# Patient Record
Sex: Male | Born: 1955 | Race: Black or African American | Hispanic: No | Marital: Single | Smoking: Current every day smoker
Health system: Southern US, Community
[De-identification: ages and names within clinical notes are randomized; demographics above are authoritative.]

## PROBLEM LIST (undated history)

## (undated) DIAGNOSIS — K219 Gastro-esophageal reflux disease without esophagitis: Secondary | ICD-10-CM

## (undated) DIAGNOSIS — R569 Unspecified convulsions: Secondary | ICD-10-CM

## (undated) DIAGNOSIS — S0990XA Unspecified injury of head, initial encounter: Secondary | ICD-10-CM

## (undated) DIAGNOSIS — I10 Essential (primary) hypertension: Secondary | ICD-10-CM

## (undated) DIAGNOSIS — S31119A Laceration without foreign body of abdominal wall, unspecified quadrant without penetration into peritoneal cavity, initial encounter: Secondary | ICD-10-CM

## (undated) HISTORY — PX: HIP SURGERY: SHX245

## (undated) HISTORY — PX: COLONOSCOPY W/ POLYPECTOMY: SHX1380

## (undated) HISTORY — PX: BACK SURGERY: SHX140

---

## 1981-09-22 HISTORY — PX: EXPLORATORY LAPAROTOMY: SUR591

## 1998-10-04 ENCOUNTER — Emergency Department (HOSPITAL_COMMUNITY): Admission: EM | Admit: 1998-10-04 | Discharge: 1998-10-04 | Payer: Self-pay | Admitting: *Deleted

## 1999-07-24 ENCOUNTER — Emergency Department (HOSPITAL_COMMUNITY): Admission: EM | Admit: 1999-07-24 | Discharge: 1999-07-24 | Payer: Self-pay | Admitting: Emergency Medicine

## 1999-10-01 HISTORY — PX: CHEST TUBE INSERTION: SHX231

## 2000-03-14 ENCOUNTER — Inpatient Hospital Stay (HOSPITAL_COMMUNITY): Admission: EM | Admit: 2000-03-14 | Discharge: 2000-03-18 | Payer: Self-pay

## 2000-03-14 ENCOUNTER — Encounter: Payer: Self-pay | Admitting: Surgery

## 2000-03-15 ENCOUNTER — Encounter: Payer: Self-pay | Admitting: Surgery

## 2000-03-16 ENCOUNTER — Encounter: Payer: Self-pay | Admitting: Surgery

## 2000-03-17 ENCOUNTER — Encounter: Payer: Self-pay | Admitting: Surgery

## 2008-09-13 ENCOUNTER — Emergency Department (HOSPITAL_COMMUNITY): Admission: EM | Admit: 2008-09-13 | Discharge: 2008-09-13 | Payer: Self-pay | Admitting: Emergency Medicine
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
## 2011-09-05 ENCOUNTER — Emergency Department (HOSPITAL_COMMUNITY)
Admission: EM | Admit: 2011-09-05 | Discharge: 2011-09-05 | Disposition: A | Discharge status: Home / Self Care | Payer: 59 | Attending: Emergency Medicine | Admitting: Emergency Medicine

## 2011-09-05 DIAGNOSIS — R0981 Nasal congestion: Secondary | ICD-10-CM

## 2011-09-05 DIAGNOSIS — J3489 Other specified disorders of nose and nasal sinuses: Secondary | ICD-10-CM | POA: Insufficient documentation

## 2011-09-05 DIAGNOSIS — M62838 Other muscle spasm: Secondary | ICD-10-CM | POA: Insufficient documentation

## 2011-09-05 DIAGNOSIS — D179 Benign lipomatous neoplasm, unspecified: Secondary | ICD-10-CM | POA: Insufficient documentation

## 2011-09-05 HISTORY — DX: Unspecified convulsions: R56.9

## 2011-09-05 HISTORY — DX: Essential (primary) hypertension: I10

## 2011-09-05 MED ORDER — DIPHENHYDRAMINE HCL 25 MG PO TABS: 25.0000 mg | ORAL_TABLET | Freq: Four times a day (QID) | ORAL | Status: AC

## 2011-09-05 MED ORDER — IBUPROFEN 800 MG PO TABS
800.0000 mg | ORAL_TABLET | Freq: Once | ORAL | Status: AC
  Administered 2011-09-05: 800 mg via ORAL
  Filled 2011-09-05: qty 1

## 2011-09-05 MED ORDER — IBUPROFEN 800 MG PO TABS: 800.0000 mg | ORAL_TABLET | Freq: Three times a day (TID) | ORAL | Status: AC

## 2011-09-05 MED ORDER — DIPHENHYDRAMINE HCL 25 MG PO CAPS
25.0000 mg | ORAL_CAPSULE | Freq: Once | ORAL | Status: AC
  Administered 2011-09-05: 25 mg via ORAL
  Filled 2011-09-05: qty 1

## 2011-09-05 MED ORDER — CYCLOBENZAPRINE HCL 10 MG PO TABS: 10.0000 mg | ORAL_TABLET | Freq: Three times a day (TID) | ORAL | Status: AC | PRN

## 2011-09-05 NOTE — ED Notes (Signed)
Pt reports that he has a mass on his back for several years which is getting bigger.  Mass is located on the upper back, midline.  Pt reports occasional stiffness of the neck.  Pt states "I need my head checked.  I hear a squeeking noise when I turn my head.  Sounds like fluid swooshing".  Denies nausea/vomiting.  Pt reports that at times when he turns his head, the neck pops on the right side and causes pain.  No medicine taken.  No PCP.

## 2011-09-05 NOTE — ED Provider Notes (Signed)
History     CSN: [id number] Arrival date & time: 09/05/2011  4:56 PM   First MD Initiated Contact with Patient 09/05/11 1739      Chief Complaint  Patient presents with  . Back Pain    Cervical area of spine    (Consider location/radiation/quality/duration/timing/severity/associated sxs/prior treatment) HPI  Patient presents to ER complaining of a multiple month hx of a "swooshing sensation in ears" and "fullness in ears" that is intermittent and aggravated with movement of head but denies associated fevers, chills, HA, dizziness, nausea or vomiting. Patient also complaining of a multiple year hx of a fatty mass on his upper back that is non tender but that he states has grown in size over the years. Patient is also complaining of a multiple month hx of intermittent right lateral neck pain and stiffness with pain aggravated by movement but then will resolve on its own because patient has taken no medications for any of the complaints PTA. Patient has no PCP as this times and takes no meds on regular basis but states that he has hx of HTN and seizure disorder.   Past Medical History  Diagnosis Date  . Hypertension   . Seizures     Past Surgical History  Procedure Date  . Abdominal surgery   . Back surgery     No family history on file.  History  Substance Use Topics  . Smoking status: Current Everyday Smoker -- 0.5 packs/day    Types: Cigarettes  . Smokeless tobacco: Not on file  . Alcohol Use: Yes     Weekends      Review of Systems  All other systems reviewed and are negative.    Allergies  Review of patient's allergies indicates no known allergies.  Home Medications   Current Outpatient Rx  Name Route Sig Dispense Refill  . CYCLOBENZAPRINE HCL 10 MG PO TABS Oral Take 1 tablet (10 mg total) by mouth 3 (three) times daily as needed for muscle spasms. 30 tablet 0  . DIPHENHYDRAMINE HCL 25 MG PO TABS Oral Take 1 tablet (25 mg total) by mouth every 6 (six) hours.  20 tablet 0  . IBUPROFEN 800 MG PO TABS Oral Take 1 tablet (800 mg total) by mouth 3 (three) times daily. 21 tablet 0    BP 154/75  Pulse 82  Temp(Src) 98.5 F (36.9 C) (Oral)  Resp 16  SpO2 97%  Physical Exam  Nursing note and vitals reviewed. Constitutional: He is oriented to person, place, and time. He appears well-developed and well-nourished. No distress.  HENT:  Head: Normocephalic and atraumatic.  Right Ear: External ear normal.  Left Ear: External ear normal.  Mouth/Throat: Oropharynx is clear and moist.  Eyes: Conjunctivae and EOM are normal. Pupils are equal, round, and reactive to light.  Neck: Normal range of motion. Neck supple.  Cardiovascular: Normal rate, regular rhythm, normal heart sounds and intact distal pulses.  Exam reveals no gallop and no friction rub.   No murmur heard. Pulmonary/Chest: Effort normal and breath sounds normal. No respiratory distress. He has no wheezes. He has no rales. He exhibits no tenderness.  Abdominal: Bowel sounds are normal. He exhibits no distension and no mass. There is no tenderness. There is no rebound and no guarding.  Musculoskeletal: Normal range of motion. He exhibits no edema and no tenderness.       Large baseball size fatty tumor like mass of central upper back without TTP and no erythema or heat  Mild  TTP of right lateral lower neck with muscle spasticity but no cspine TTP.   Neurological: He is alert and oriented to person, place, and time.  Skin: Skin is warm and dry. No rash noted. He is not diaphoretic. No erythema.  Psychiatric: He has a normal mood and affect.    ED Course  Procedures (including critical care time)  Labs Reviewed - No data to display No results found.   1. Nasal congestion   2. Lipoma   3. Muscle spasm       MDM  Afebrile, alert and oriented without neurofocal findings with a long standing hx of intermittent "swooshing sensation and fullness in ears" likely congestion and drainage with  no ataxia. Soft tissue TTP of lower lateral neck with muscle spasticity that is chronic, waxing and waning. And chronic lipoma of back without signs or symptoms of abscess or infection.         Jenness Corner, [place] 09/05/11 2134

## 2011-09-06 NOTE — ED Provider Notes (Signed)
Medical screening examination/treatment/procedure(s) were performed by non-physician practitioner and as supervising physician I was immediately available for consultation/collaboration.  Raeford Razor, MD 09/06/11 [phone]

## 2012-01-18 ENCOUNTER — Encounter (HOSPITAL_COMMUNITY): Payer: Self-pay

## 2012-01-18 ENCOUNTER — Emergency Department (HOSPITAL_COMMUNITY)
Admission: EM | Admit: 2012-01-18 | Discharge: 2012-01-18 | Disposition: A | Discharge status: Home / Self Care | Payer: 59 | Source: Home / Self Care

## 2012-01-18 DIAGNOSIS — R569 Unspecified convulsions: Secondary | ICD-10-CM

## 2012-01-18 DIAGNOSIS — I10 Essential (primary) hypertension: Secondary | ICD-10-CM

## 2012-01-18 LAB — POCT I-STAT, CHEM 8
Creatinine, Ser: 1.1 mg/dL (ref 0.50–1.35)
HCT: 46 % (ref 39.0–52.0)
Hemoglobin: 15.6 g/dL (ref 13.0–17.0)
Potassium: 4.1 mEq/L (ref 3.5–5.1)
Sodium: 139 mEq/L (ref 135–145)

## 2012-01-18 NOTE — Discharge Instructions (Signed)
You cannot drive for 6 months after having a seizure. No drinking! Call [facility] for assistance obtaining a primary care dr for follow up of your high blood pressure, and seizures.  Return as needed.
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
## 2012-01-18 NOTE — ED Provider Notes (Signed)
History     CSN: [id number]  Arrival date & time 01/18/12  1138   None     Chief Complaint  Patient presents with  . Seizures    (Consider location/radiation/quality/duration/timing/severity/associated sxs/prior treatment) HPI Comments: Patient presents today reporting that he had the seizure 6 days ago. He admits that he was drinking heavily Saturday and Sunday last weekend, and that he had one seizure on Sunday. He denies any seizures since then. Patient reports that he does have a history of seizure disorder. He was on phenobarbital, and was weaned off over 20 years ago. Patient is also concerned, and brings paperwork from an employee her screening which showed an elevated blood pressure of 198/120 on 10/16/2011 . He does not have a primary care physician and has not had blood pressure rechecked prior to today. He has seen a neurologist in the past, over 20 years ago. He denies headaches, dizziness, chest discomfort, or dyspnea.   Past Medical History  Diagnosis Date  . Hypertension   . Seizures     Past Surgical History  Procedure Date  . Abdominal surgery   . Back surgery     History reviewed. No pertinent family history.  History  Substance Use Topics  . Smoking status: Current Everyday Smoker -- 0.5 packs/day    Types: Cigarettes  . Smokeless tobacco: Not on file  . Alcohol Use: Yes     Weekends      Review of Systems  Constitutional: Negative for fever and chills.  Respiratory: Negative for cough and shortness of breath.   Cardiovascular: Negative for chest pain.  Neurological: Positive for seizures. Negative for dizziness and headaches.    Allergies  Review of patient's allergies indicates no known allergies.  Home Medications  No current outpatient prescriptions on file.  BP 153/88  Pulse 87  Temp(Src) 99 F (37.2 C) (Oral)  Resp 19  SpO2 97%  Physical Exam  Nursing note and vitals reviewed. Constitutional: He is oriented to person, place,  and time. He appears well-developed and well-nourished. No distress.  HENT:  Head: Normocephalic and atraumatic.  Right Ear: Tympanic membrane, external ear and ear canal normal.  Left Ear: Tympanic membrane, external ear and ear canal normal.  Nose: Nose normal.  Mouth/Throat: Uvula is midline, oropharynx is clear and moist and mucous membranes are normal. No oropharyngeal exudate, posterior oropharyngeal edema or posterior oropharyngeal erythema.  Eyes: Conjunctivae and EOM are normal. Pupils are equal, round, and reactive to light.  Neck: Neck supple.  Cardiovascular: Normal rate, regular rhythm and normal heart sounds.   Pulmonary/Chest: Effort normal and breath sounds normal. No respiratory distress.  Musculoskeletal: Normal range of motion.  Lymphadenopathy:    He has no cervical adenopathy.  Neurological: He is alert and oriented to person, place, and time. No cranial nerve deficit.  Skin: Skin is warm and dry.  Psychiatric: He has a normal mood and affect.    ED Course  Procedures (including critical care time)   Labs Reviewed  POCT I-STAT, CHEM 8   No results found.   1. Seizure   2. Hypertension       MDM  Single seizure 6 days ago. Suspect alcohol withdrawal. Hx of seizure d/o, treated > 20 yrs ago per pt. Systolic BP elevation. Hx of same Dec 2012 ED visit, with reported BP elevation at employer screening.  Encouraged pt to obtain PCP for f/u of BP and seizures. Advised no driving for 6 mos, unless cleared earlier by PCP.  Melody Comas, [place] 01/18/12 1243

## 2012-01-22 NOTE — ED Provider Notes (Signed)
Medical screening examination/treatment/procedure(s) were performed by resident physician or non-physician practitioner and as supervising physician I was immediately available for consultation/collaboration.   Barkley Bruns MD.    Linna Hoff, MD 01/22/12 [phone]

## 2013-02-04 ENCOUNTER — Emergency Department (HOSPITAL_COMMUNITY)
Admission: EM | Admit: 2013-02-04 | Discharge: 2013-02-04 | Disposition: A | Discharge status: Home / Self Care | Payer: 59 | Source: Home / Self Care | Attending: Family Medicine | Admitting: Family Medicine

## 2013-02-04 ENCOUNTER — Encounter (HOSPITAL_COMMUNITY): Payer: Self-pay | Admitting: *Deleted

## 2013-02-04 DIAGNOSIS — R221 Localized swelling, mass and lump, neck: Secondary | ICD-10-CM

## 2013-02-04 DIAGNOSIS — R220 Localized swelling, mass and lump, head: Secondary | ICD-10-CM

## 2013-02-04 MED ORDER — TRAMADOL HCL 50 MG PO TABS: 50.0000 mg | ORAL_TABLET | Freq: Four times a day (QID) | ORAL | Status: DC | PRN

## 2013-02-04 MED ORDER — IBUPROFEN 600 MG PO TABS: 600.0000 mg | ORAL_TABLET | Freq: Three times a day (TID) | ORAL | Status: DC

## 2013-02-04 MED ORDER — CEPHALEXIN 500 MG PO CAPS: 500.0000 mg | ORAL_CAPSULE | Freq: Two times a day (BID) | ORAL | Status: DC

## 2013-02-04 NOTE — ED Notes (Signed)
Pt  Reports  Neck  Pain  And  Swollen  Area      To  Neck     X  sev  Months           Pt  Reports    Pain  And  Some  Swelling to r  Upper  Chest        Pt    Reports  He  Does  Lots  Of  Lifting  At  Work

## 2013-02-05 NOTE — ED Provider Notes (Signed)
History     CSN: [id number]  Arrival date & time 02/04/13  1705   First MD Initiated Contact with Patient 02/04/13 1818      Chief Complaint  Patient presents with  . Torticollis    (Consider location/radiation/quality/duration/timing/severity/associated sxs/prior treatment) HPI Comments: 57 year old smoker male with history of hypertension. Here complaining of right-sided neck lump for months. Lump has become tender in the last few days. He works lifting tires. He also has a mass at the base of his neck in the Center upper back that he thinks came after he had a back surgery years ago. Denies fever or chills. No general malaise. No weight loss. No chest pain, cough or shortness of breath.  Patient also wants me to check his right clavicle as he has not is some deformity for years, reports intermittent pain with heavy lifting. Denies redness, denies upper extremity numbness, weakness or paresthesias. In an and   Past Medical History  Diagnosis Date  . Hypertension   . Seizures     Past Surgical History  Procedure Laterality Date  . Abdominal surgery    . Back surgery      No family history on file.  History  Substance Use Topics  . Smoking status: Current Every Day Smoker -- 0.50 packs/day    Types: Cigarettes  . Smokeless tobacco: Not on file  . Alcohol Use: Yes     Comment: Weekends      Review of Systems  Constitutional: Negative for fever, chills, diaphoresis, appetite change, fatigue and unexpected weight change.  HENT: Negative for ear pain, congestion, sore throat, mouth sores, neck stiffness and sinus pressure.   Respiratory: Negative for cough, shortness of breath and wheezing.   Cardiovascular: Negative for chest pain and leg swelling.  Gastrointestinal: Negative for nausea, vomiting and abdominal pain.  Musculoskeletal:       As per HPI  Skin:       As per HPI  Neurological: Negative for dizziness and headaches.  All other systems reviewed and are  negative.    Allergies  Review of patient's allergies indicates no known allergies.  Home Medications   Current Outpatient Rx  Name  Route  Sig  Dispense  Refill  . cephALEXin (KEFLEX) 500 MG capsule   Oral   Take 1 capsule (500 mg total) by mouth 2 (two) times daily.   20 capsule   0   . ibuprofen (ADVIL,MOTRIN) 600 MG tablet   Oral   Take 1 tablet (600 mg total) by mouth 3 (three) times daily. Take it with food   30 tablet   0   . traMADol (ULTRAM) 50 MG tablet   Oral   Take 1 tablet (50 mg total) by mouth every 6 (six) hours as needed for pain.   15 tablet   0     BP 149/73  Pulse 79  Temp(Src) 98.3 F (36.8 C) (Oral)  Resp 16  SpO2 100%  Physical Exam  Nursing note and vitals reviewed. Constitutional: He is oriented to person, place, and time. He appears well-developed and well-nourished. No distress.  HENT:  Head: Normocephalic and atraumatic.  Right Ear: External ear normal.  Left Ear: External ear normal.  Nose: Nose normal.  Mouth/Throat: Oropharynx is clear and moist. No oropharyngeal exudate.  Eyes: Conjunctivae and EOM are normal. Pupils are equal, round, and reactive to light. Right eye exhibits no discharge. Left eye exhibits no discharge. No scleral icterus.  Neck: Normal range of motion. Neck  supple. No JVD present. No thyromegaly present.  Cardiovascular: Normal rate, regular rhythm and normal heart sounds.   Pulmonary/Chest: Effort normal and breath sounds normal. No respiratory distress. He has no wheezes. He has no rales. He exhibits no tenderness.  Musculoskeletal:  Right acromioclavicular joint appears more prominent than left. No tenderness to palpation. Otherwise no obvious deformity of the clavicular shaft. No associated soft tissue swelling or supraclavicular adenopathies. Patient present with FROM of right shoulder and arm.   Neurological: He is alert and oriented to person, place, and time.  Skin: He is not diaphoretic.  There is a  tender mass about 2 cm soft in right suboccipital area. Mildly tender to palpation, firm but no indurated no fluctuation no erythema associated. No attached to deep planes. Patient shaves his scalp. Scalp skin appear intact with no dandruff or skin brakes or signs of infection.   There is a large mass about 10x10 cm firm no attached to deep planes, no tender, no fluctuant located in center of upper back at base of neck between scapulae. No signs of infection.      ED Course  Procedures (including critical care time)  Labs Reviewed - No data to display No results found.   1. Localized swelling, mass or lump of neck       MDM  Treated with Keflex, ibuprofen and tramadol. Impress irritated sebaceus cyst likely due to constant rubbing. Does not impress an abscess.  I explained patient that he needs to have followup for his "neck lump" as there is a possibility of a inflamed sebaceous/epidermal cyst or a reactive lymph node. He also has a large skin mass in his upper back likely a sebaceous cyst. Patient has medical insurance and I provided him with a list of primary care offices in our area for him to followup and monitor his symptoms I explained to him that he might require biopsy or surgical removal and also provided with contact information for [facility]. Supportive care and red flags should prompt his return to medical attention discussed with patient and provided in writing        Sharin Grave, MD 02/05/13 1253

## 2013-02-15 ENCOUNTER — Encounter (INDEPENDENT_AMBULATORY_CARE_PROVIDER_SITE_OTHER): Payer: Self-pay | Admitting: Surgery

## 2013-02-15 ENCOUNTER — Ambulatory Visit (INDEPENDENT_AMBULATORY_CARE_PROVIDER_SITE_OTHER): Payer: 59 | Admitting: Surgery

## 2013-02-15 ENCOUNTER — Telehealth (INDEPENDENT_AMBULATORY_CARE_PROVIDER_SITE_OTHER): Payer: Self-pay

## 2013-02-15 VITALS — BP 168/82 | HR 66 | Resp 18 | Ht 68.0 in | Wt 180.0 lb

## 2013-02-15 DIAGNOSIS — R599 Enlarged lymph nodes, unspecified: Secondary | ICD-10-CM | POA: Insufficient documentation

## 2013-02-15 DIAGNOSIS — R229 Localized swelling, mass and lump, unspecified: Secondary | ICD-10-CM

## 2013-02-15 DIAGNOSIS — R222 Localized swelling, mass and lump, trunk: Secondary | ICD-10-CM

## 2013-02-15 DIAGNOSIS — Z720 Tobacco use: Secondary | ICD-10-CM

## 2013-02-15 DIAGNOSIS — F17200 Nicotine dependence, unspecified, uncomplicated: Secondary | ICD-10-CM

## 2013-02-15 MED ORDER — NAPROXEN 500 MG PO TABS: 500.0000 mg | ORAL_TABLET | Freq: Two times a day (BID) | ORAL | Status: DC

## 2013-02-15 NOTE — Patient Instructions (Addendum)
I believe the sore lump on the back of your neck is an irritated lymph node.  Try naproxen 500 mg twice a day and heat six times a day for three weeks.  It should help calm down.  If it does not or worsens, consider surgical removal.  Lymphadenopathy Lymphadenopathy means "disease of the lymph glands." But the term is usually used to describe swollen or enlarged lymph glands, also called lymph nodes. These are the bean-shaped organs found in many locations including the neck, underarm, and groin. Lymph glands are part of the immune system, which fights infections in your body. Lymphadenopathy can occur in just one area of the body, such as the neck, or it can be generalized, with lymph node enlargement in several areas. The nodes found in the neck are the most common sites of lymphadenopathy. CAUSES  When your immune system responds to germs (such as viruses or bacteria ), infection-fighting cells and fluid build up. This causes the glands to grow in size. This is usually not something to worry about. Sometimes, the glands themselves can become infected and inflamed. This is called lymphadenitis. Enlarged lymph nodes can be caused by many diseases:  Bacterial disease, such as strep throat or a skin infection.  Viral disease, such as a common cold.  Other germs, such as lyme disease, tuberculosis, or sexually transmitted diseases.  Cancers, such as lymphoma (cancer of the lymphatic system) or leukemia (cancer of the white blood cells).  Inflammatory diseases such as lupus or rheumatoid arthritis.  Reactions to medications. Many of the diseases above are rare, but important. This is why you should see your caregiver if you have lymphadenopathy. SYMPTOMS   Swollen, enlarged lumps in the neck, back of the head or other locations.  Tenderness.  Warmth or redness of the skin over the lymph nodes.  Fever. DIAGNOSIS  Enlarged lymph nodes are often near the source of infection. They can help  healthcare providers diagnose your illness. For instance:   Swollen lymph nodes around the jaw might be caused by an infection in the mouth.  Enlarged glands in the neck often signal a throat infection.  Lymph nodes that are swollen in more than one area often indicate an illness caused by a virus. Your caregiver most likely will know what is causing your lymphadenopathy after listening to your history and examining you. Blood tests, x-rays or other tests may be needed. If the cause of the enlarged lymph node cannot be found, and it does not go away by itself, then a biopsy may be needed. Your caregiver will discuss this with you. TREATMENT  Treatment for your enlarged lymph nodes will depend on the cause. Many times the nodes will shrink to normal size by themselves, with no treatment. Antibiotics or other medicines may be needed for infection. Only take over-the-counter or prescription medicines for pain, discomfort or fever as directed by your caregiver. HOME CARE INSTRUCTIONS  Swollen lymph glands usually return to normal when the underlying medical condition goes away. If they persist, contact your health-care provider. He/she might prescribe antibiotics or other treatments, depending on the diagnosis. Take any medications exactly as prescribed. Keep any follow-up appointments made to check on the condition of your enlarged nodes.  SEEK MEDICAL CARE IF:   Swelling lasts for more than two weeks.  You have symptoms such as weight loss, night sweats, fatigue or fever that does not go away.  The lymph nodes are hard, seem fixed to the skin or are growing  rapidly.  Skin over the lymph nodes is red and inflamed. This could mean there is an infection. SEEK IMMEDIATE MEDICAL CARE IF:   Fluid starts leaking from the area of the enlarged lymph node.  You develop a fever of 102 F (38.9 C) or greater.  Severe pain develops (not necessarily at the site of a large lymph node).  You develop  chest pain or shortness of breath.  You develop worsening abdominal pain. MAKE SURE YOU:   Understand these instructions.  Will watch your condition.  Will get help right away if you are not doing well or get worse. Document Released: 06/25/2008 Document Revised: 12/09/2011 Document Reviewed: 06/25/2008 [facility] Patient Information 2013 [place], [place].  The larger mass on your upper back is most likely a lipoma.  Consider removal if it is bothering you.  It seems to gotten larger since we have documented that 20 years ago.  Lipoma A lipoma is a noncancerous (benign) tumor composed of fat cells. They are usually found under the skin (subcutaneous). A lipoma may occur in any tissue of the body that contains fat. Common areas for lipomas to appear include the back, shoulders, buttocks, and thighs. Lipomas are a very common soft tissue growth. They are soft and grow slowly. Most problems caused by a lipoma depend on where it is growing. DIAGNOSIS  A lipoma can be diagnosed with a physical exam. These tumors rarely become cancerous, but radiographic studies can help determine this for certain. Studies used may include:  Computerized X-ray scans (CT or CAT scan).  Computerized magnetic scans (MRI). TREATMENT  Small lipomas that are not causing problems may be watched. If a lipoma continues to enlarge or causes problems, removal is often the best treatment. Lipomas can also be removed to improve appearance. Surgery is done to remove the fatty cells and the surrounding capsule. Most often, this is done with medicine that numbs the area (local anesthetic). The removed tissue is examined under a microscope to make sure it is not cancerous. Keep all follow-up appointments with your caregiver. SEEK MEDICAL CARE IF:   The lipoma becomes larger or hard.  The lipoma becomes painful, red, or increasingly swollen. These could be signs of infection or a more serious condition. Document Released:  09/06/2002 Document Revised: 12/09/2011 Document Reviewed: 02/16/2010 [facility] Patient Information 2013 [place], [place].

## 2013-02-15 NOTE — Telephone Encounter (Signed)
Called to inform patient that prescription for Naproxen has been signed and submitted by Dr. Michaell Cowing to [facility] on [address]
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
## 2013-02-26 ENCOUNTER — Encounter (INDEPENDENT_AMBULATORY_CARE_PROVIDER_SITE_OTHER): Payer: Self-pay

## 2013-06-08 ENCOUNTER — Emergency Department (HOSPITAL_COMMUNITY)
Admission: EM | Admit: 2013-06-08 | Discharge: 2013-06-08 | Disposition: A | Discharge status: Home / Self Care | Payer: 59 | Source: Home / Self Care

## 2013-06-08 ENCOUNTER — Encounter (HOSPITAL_COMMUNITY): Payer: Self-pay | Admitting: Emergency Medicine

## 2013-06-08 DIAGNOSIS — I10 Essential (primary) hypertension: Secondary | ICD-10-CM

## 2013-06-08 DIAGNOSIS — T50995A Adverse effect of other drugs, medicaments and biological substances, initial encounter: Secondary | ICD-10-CM

## 2013-06-08 DIAGNOSIS — T7840XA Allergy, unspecified, initial encounter: Secondary | ICD-10-CM

## 2013-06-08 DIAGNOSIS — R220 Localized swelling, mass and lump, head: Secondary | ICD-10-CM

## 2013-06-08 MED ORDER — TRIAMCINOLONE ACETONIDE 40 MG/ML IJ SUSP
INTRAMUSCULAR | Status: AC
  Filled 2013-06-08: qty 1

## 2013-06-08 MED ORDER — TRIAMCINOLONE ACETONIDE 40 MG/ML IJ SUSP
40.0000 mg | Freq: Once | INTRAMUSCULAR | Status: AC
  Administered 2013-06-08: 40 mg via INTRAMUSCULAR

## 2013-06-08 NOTE — ED Notes (Addendum)
Patient believes he is having an allergic reaction to tramadol, reports taking one pill, then having swelling of face and lips.  Denies painful teeth.  Patient took tramadol to treat a headache.  Patient is having a colonoscopy tomorrow per patient

## 2013-06-08 NOTE — ED Notes (Signed)
Patient requested work note-verified with [employer], rn

## 2013-06-08 NOTE — ED Notes (Signed)
Patient not ready for discharge, medicines have been ordered
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
## 2013-06-09 NOTE — ED Provider Notes (Signed)
Medical screening examination/treatment/procedure(s) were performed by resident physician or non-physician practitioner and as supervising physician I was immediately available for consultation/collaboration.   Elfrieda Espino DOUGLAS MD.   Yannis Broce D Mourad Cwikla, MD 06/09/13 1717 

## 2013-07-05 ENCOUNTER — Encounter (INDEPENDENT_AMBULATORY_CARE_PROVIDER_SITE_OTHER): Payer: Self-pay

## 2013-07-05 ENCOUNTER — Encounter (INDEPENDENT_AMBULATORY_CARE_PROVIDER_SITE_OTHER): Payer: Self-pay | Admitting: Surgery

## 2013-07-05 ENCOUNTER — Telehealth (INDEPENDENT_AMBULATORY_CARE_PROVIDER_SITE_OTHER): Payer: Self-pay | Admitting: Surgery

## 2013-07-05 ENCOUNTER — Ambulatory Visit (INDEPENDENT_AMBULATORY_CARE_PROVIDER_SITE_OTHER): Payer: 59 | Admitting: Surgery

## 2013-07-05 VITALS — BP 140/82 | HR 80 | Temp 98.2°F | Resp 14 | Ht 66.0 in | Wt 174.6 lb

## 2013-07-05 DIAGNOSIS — R229 Localized swelling, mass and lump, unspecified: Secondary | ICD-10-CM

## 2013-07-05 DIAGNOSIS — R222 Localized swelling, mass and lump, trunk: Secondary | ICD-10-CM

## 2013-07-05 NOTE — Patient Instructions (Addendum)
See the Handout(s) we gave you.  Consider surgery.  Please call our office at [phone] if you wish to schedule surgery or if you have further questions / concerns.   I think the mass on your neck is a lymph node.  Because it has been there for a long period, consider removal  Lymphadenopathy Lymphadenopathy means "disease of the lymph glands." But the term is usually used to describe swollen or enlarged lymph glands, also called lymph nodes. These are the bean-shaped organs found in many locations including the neck, underarm, and groin. Lymph glands are part of the immune system, which fights infections in your body. Lymphadenopathy can occur in just one area of the body, such as the neck, or it can be generalized, with lymph node enlargement in several areas. The nodes found in the neck are the most common sites of lymphadenopathy. CAUSES  When your immune system responds to germs (such as viruses or bacteria ), infection-fighting cells and fluid build up. This causes the glands to grow in size. This is usually not something to worry about. Sometimes, the glands themselves can become infected and inflamed. This is called lymphadenitis. Enlarged lymph nodes can be caused by many diseases:  Bacterial disease, such as strep throat or a skin infection.  Viral disease, such as a common cold.  Other germs, such as lyme disease, tuberculosis, or sexually transmitted diseases.  Cancers, such as lymphoma (cancer of the lymphatic system) or leukemia (cancer of the white blood cells).  Inflammatory diseases such as lupus or rheumatoid arthritis.  Reactions to medications. Many of the diseases above are rare, but important. This is why you should see your caregiver if you have lymphadenopathy. SYMPTOMS   Swollen, enlarged lumps in the neck, back of the head or other locations.  Tenderness.  Warmth or redness of the skin over the lymph nodes.  Fever. DIAGNOSIS  Enlarged lymph nodes are  often near the source of infection. They can help healthcare providers diagnose your illness. For instance:   Swollen lymph nodes around the jaw might be caused by an infection in the mouth.  Enlarged glands in the neck often signal a throat infection.  Lymph nodes that are swollen in more than one area often indicate an illness caused by a virus. Your caregiver most likely will know what is causing your lymphadenopathy after listening to your history and examining you. Blood tests, x-rays or other tests may be needed. If the cause of the enlarged lymph node cannot be found, and it does not go away by itself, then a biopsy may be needed. Your caregiver will discuss this with you. TREATMENT  Treatment for your enlarged lymph nodes will depend on the cause. Many times the nodes will shrink to normal size by themselves, with no treatment. Antibiotics or other medicines may be needed for infection. Only take over-the-counter or prescription medicines for pain, discomfort or fever as directed by your caregiver. HOME CARE INSTRUCTIONS  Swollen lymph glands usually return to normal when the underlying medical condition goes away. If they persist, contact your health-care provider. He/she might prescribe antibiotics or other treatments, depending on the diagnosis. Take any medications exactly as prescribed. Keep any follow-up appointments made to check on the condition of your enlarged nodes.  SEEK MEDICAL CARE IF:   Swelling lasts for more than two weeks.  You have symptoms such as weight loss, night sweats, fatigue or fever that does not go away.  The lymph nodes are hard, seem  fixed to the skin or are growing rapidly.  Skin over the lymph nodes is red and inflamed. This could mean there is an infection. SEEK IMMEDIATE MEDICAL CARE IF:   Fluid starts leaking from the area of the enlarged lymph node.  You develop a fever of 102 F (38.9 C) or greater.  Severe pain develops (not necessarily at  the site of a large lymph node).  You develop chest pain or shortness of breath.  You develop worsening abdominal pain. MAKE SURE YOU:   Understand these instructions.  Will watch your condition.  Will get help right away if you are not doing well or get worse. Document Released: 06/25/2008 Document Revised: 12/09/2011 Document Reviewed: 06/25/2008 [facility] Patient Information 2014 [place], [place].  Consider removal back mass.  It is probably a lipomaLipoma A lipoma is a noncancerous (benign) tumor composed of fat cells. They are usually found under the skin (subcutaneous). A lipoma may occur in any tissue of the body that contains fat. Common areas for lipomas to appear include the back, shoulders, buttocks, and thighs. Lipomas are a very common soft tissue growth. They are soft and grow slowly. Most problems caused by a lipoma depend on where it is growing. DIAGNOSIS  A lipoma can be diagnosed with a physical exam. These tumors rarely become cancerous, but radiographic studies can help determine this for certain. Studies used may include:  Computerized X-ray scans (CT or CAT scan).  Computerized magnetic scans (MRI). TREATMENT  Small lipomas that are not causing problems may be watched. If a lipoma continues to enlarge or causes problems, removal is often the best treatment. Lipomas can also be removed to improve appearance. Surgery is done to remove the fatty cells and the surrounding capsule. Most often, this is done with medicine that numbs the area (local anesthetic). The removed tissue is examined under a microscope to make sure it is not cancerous. Keep all follow-up appointments with your caregiver. SEEK MEDICAL CARE IF:   The lipoma becomes larger or hard.  The lipoma becomes painful, red, or increasingly swollen. These could be signs of infection or a more serious condition. Document Released: 09/06/2002 Document Revised: 12/09/2011 Document Reviewed: 02/16/2010 [facility]  Patient Information 2014 [place], [place].  GENERAL SURGERY: POST OP INSTRUCTIONS  1. DIET: Follow a light bland diet the first 24 hours after arrival home, such as soup, liquids, crackers, etc.  Be sure to include lots of fluids daily.  Avoid fast food or heavy meals as your are more likely to get nauseated.   2. Take your usually prescribed home medications unless otherwise directed. 3. PAIN CONTROL: a. Pain is best controlled by a usual combination of three different methods TOGETHER: i. Ice/Heat ii. Over the counter pain medication iii. Prescription pain medication b. Most patients will experience some swelling and bruising around the incisions.  Ice packs or heating pads (30-60 minutes up to 6 times a day) will help. Use ice for the first few days to help decrease swelling and bruising, then switch to heat to help relax tight/sore spots and speed recovery.  Some people prefer to use ice alone, heat alone, alternating between ice & heat.  Experiment to what works for you.  Swelling and bruising can take several weeks to resolve.   c. It is helpful to take an over-the-counter pain medication regularly for the first few weeks.  Choose one of the following that works best for you: i. Naproxen (Aleve, etc)  Two 220mg  tabs twice a day ii.  Ibuprofen (Advil, etc) Three 200mg  tabs four times a day (every meal & bedtime) iii. Acetaminophen (Tylenol, etc) 500-650mg  four times a day (every meal & bedtime) d. A  prescription for pain medication (such as oxycodone, hydrocodone, etc) should be given to you upon discharge.  Take your pain medication as prescribed.  i. If you are having problems/concerns with the prescription medicine (does not control pain, nausea, vomiting, rash, itching, etc), please call us [phone] to see if we need to switch you to a different pain medicine that will work better for you and/or control your side effect better. ii. If you need a refill on your pain medication, please  contact your pharmacy.  They will contact our office to request authorization. Prescriptions will not be filled after 5 pm or on week-ends. 4. Avoid getting constipated.  Between the surgery and the pain medications, it is common to experience some constipation.  Increasing fluid intake and taking a fiber supplement (such as Metamucil, Citrucel, FiberCon, MiraLax, etc) 1-2 times a day regularly will usually help prevent this problem from occurring.  A mild laxative (prune juice, Milk of Magnesia, MiraLax, etc) should be taken according to package directions if there are no bowel movements after 48 hours.   5. Wash / shower every day.  You may shower over the dressings as they are waterproof.  Continue to shower over incision(s) after the dressing is off. 6. Remove your waterproof bandages 5 days after surgery.  You may leave the incision open to air.  You may have skin tapes (Steri Strips) covering the incision(s).  Leave them on until one week, then remove.  You may replace a dressing/Band-Aid to cover the incision for comfort if you wish.      7. ACTIVITIES as tolerated:   a. You may resume regular (light) daily activities beginning the next day-such as daily self-care, walking, climbing stairs-gradually increasing activities as tolerated.  If you can walk 30 minutes without difficulty, it is safe to try more intense activity such as jogging, treadmill, bicycling, low-impact aerobics, swimming, etc. b. Save the most intensive and strenuous activity for last such as sit-ups, heavy lifting, contact sports, etc  Refrain from any heavy lifting or straining until you are off narcotics for pain control.   c. DO NOT PUSH THROUGH PAIN.  Let pain be your guide: If it hurts to do something, don't do it.  Pain is your body warning you to avoid that activity for another week until the pain goes down. d. You may drive when you are no longer taking prescription pain medication, you can comfortably wear a seatbelt,  and you can safely maneuver your car and apply brakes. e. Bonita Quin may have sexual intercourse when it is comfortable.  8. FOLLOW UP in our office a. Please call [facility] at [phone] to set up an appointment to see your surgeon in the office for a follow-up appointment approximately 2-3 weeks after your surgery. b. Make sure that you call for this appointment the day you arrive home to insure a convenient appointment time. 9. IF YOU HAVE DISABILITY OR FAMILY LEAVE FORMS, BRING THEM TO THE OFFICE FOR PROCESSING.  DO NOT GIVE THEM TO YOUR DOCTOR.   WHEN TO CALL us [phone]: 1. Poor pain control 2. Reactions / problems with new medications (rash/itching, nausea, etc)  3. Fever over 101.5 F (38.5 C) 4. Worsening swelling or bruising 5. Continued bleeding from incision. 6. Increased pain, redness, or drainage from the incision  7. Difficulty breathing / swallowing   The clinic staff is available to answer your questions during regular business hours (8:30am-5pm).  Please don't hesitate to call and ask to speak to one of our nurses for clinical concerns.   If you have a medical emergency, go to the nearest emergency room or call 911.  A surgeon from [facility] is always on call at the [facility], [place] [address], [place], [place]  [address] ? MAIN: [phone] ? TOLL FREE: [phone] ?  FAX [phone] [email or web address]  DRAIN CARE:   You have a closed bulb drain to help you heal.  A bulb drain is a small, plastic reservoir which creates a gentle suction. It is used to remove excess fluid from a surgical wound. The color and amount of fluid will vary. Immediately after surgery, the fluid is bright red. It may gradually change to a yellow color. When the amount decreases to about 1 or 2 tablespoons (15 to 30 cc) per 24 hours, your caregiver will usually remove it.  DAILY CARE  Keep the bulb compressed at all  times, except while emptying it. The compression creates suction.   Keep sites where the tubes enter the skin dry and covered with a light bandage (dressing).   Tape the tubes to your skin, 1 to 2 inches below the insertion sites, to keep from pulling on your stitches. Tubes are stitched in place and will not slip out.   Pin the bulb to your shirt (not to your pants) with a safety pin.   For the first few days after surgery, there usually is more fluid in the bulb. Empty the bulb whenever it becomes half full because the bulb does not create enough suction if it is too full. Include this amount in your 24 hour totals.   When the amount of drainage decreases, empty the bulb at the same time every day. Write down the amounts and the 24 hour totals. Your caregiver will want to know them. This helps your caregiver know when the tubes can be removed.   (We anticipate removing the drain in 1-3 weeks, depending on when the output is <37mL a day for 2+ days)  If there is drainage around the tube sites, change dressings and keep the area dry. If you see a clot in the tube, leave it alone. However, if the tube does not appear to be draining, let your caregiver know.  TO EMPTY THE BULB  Open the stopper to release suction.   Holding the stopper out of the way, pour drainage into the measuring cup that was sent home with you.   Measure and write down the amount. If there are 2 bulbs, note the amount of drainage from bulb 1 or bulb 2 and keep the totals separate. Your caregiver will want to know which tube is draining more.   Compress the bulb by folding it in half.   Replace the stopper.   Check the tape that holds the tube to your skin, and pin the bulb to your shirt.  SEEK MEDICAL CARE IF:  The drainage develops a bad odor.   You have an oral temperature above 102 F (38.9 C).   The amount of drainage from your wound suddenly increases or decreases.   You accidentally pull out your drain.    You have any other questions or concerns.  MAKE SURE YOU:   Understand these instructions.  Will watch your condition.   Will get help right away if you are not doing well or get worse.     Call our office if you have any questions about your drain. [phone]

## 2013-07-05 NOTE — Telephone Encounter (Signed)
Went over pt financial responsibilities he will call back to schedule surgery. Placed in pending folder
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
## 2013-08-18 ENCOUNTER — Encounter (HOSPITAL_COMMUNITY): Payer: Self-pay | Admitting: Pharmacy Technician

## 2013-08-23 ENCOUNTER — Encounter (HOSPITAL_COMMUNITY)
Admission: RE | Admit: 2013-08-23 | Discharge: 2013-08-23 | Disposition: A | Discharge status: Home / Self Care | Payer: 59 | Source: Ambulatory Visit | Attending: Surgery | Admitting: Surgery

## 2013-08-23 ENCOUNTER — Encounter (HOSPITAL_COMMUNITY): Payer: Self-pay

## 2013-08-23 DIAGNOSIS — Z01812 Encounter for preprocedural laboratory examination: Secondary | ICD-10-CM | POA: Insufficient documentation

## 2013-08-23 DIAGNOSIS — Z01818 Encounter for other preprocedural examination: Secondary | ICD-10-CM | POA: Insufficient documentation

## 2013-08-23 HISTORY — DX: Laceration without foreign body of abdominal wall, unspecified quadrant without penetration into peritoneal cavity, initial encounter: S31.119A

## 2013-08-23 HISTORY — DX: Gastro-esophageal reflux disease without esophagitis: K21.9

## 2013-08-23 HISTORY — DX: Unspecified injury of head, initial encounter: S09.90XA

## 2013-08-23 LAB — CBC
HCT: 38.1 % — ABNORMAL LOW (ref 39.0–52.0)
Hemoglobin: 12.4 g/dL — ABNORMAL LOW (ref 13.0–17.0)
RBC: 5.01 MIL/uL (ref 4.22–5.81)
WBC: 6 10*3/uL (ref 4.0–10.5)

## 2013-08-23 LAB — BASIC METABOLIC PANEL
Chloride: 101 mEq/L (ref 96–112)
GFR calc Af Amer: 87 mL/min — ABNORMAL LOW (ref >90)
GFR calc non Af Amer: 75 mL/min — ABNORMAL LOW (ref >90)
Potassium: 4.3 mEq/L (ref 3.5–5.1)
Sodium: 137 mEq/L (ref 135–145)

## 2013-08-23 NOTE — Progress Notes (Addendum)
Pt reports EKG and CXR at [facility] [phone] in September or October of this year, records requested.  Pt denies having a Cardiologist, stress test or ECHO.  PCP is Dr. Azucena Cecil but pt reports that MD at [facility] prescribed med for HTN.

## 2013-08-23 NOTE — Pre-Procedure Instructions (Signed)
Dyshon Ingalls  08/23/2013   Your procedure is scheduled on:  Mon, Dec 1 @ 2:00 PM  Report to [facility]ance A at 12 PM.  Call this number if you have problems the morning of surgery: [phone]   Remember:   Do not eat food or drink liquids after midnight.      Do not wear jewelry.  Do not wear lotions, powders, or colognes. You may wear deodorant.  Men may shave face and neck.  Do not bring valuables to the hospital.  [facility] is not responsible                  for any belongings or valuables.               Contacts, dentures or bridgework may not be worn into surgery.  Leave suitcase in the car. After surgery it may be brought to your room.  For patients admitted to the hospital, discharge time is determined by your                treatment team.               Patients discharged the day of surgery will not be allowed to drive  home.    Special Instructions: Shower using CHG 2 nights before surgery and the night before surgery.  If you shower the day of surgery use CHG.  Use special wash - you have one bottle of CHG for all showers.  You should use approximately 1/3 of the bottle for each shower.   Please read over the following fact sheets that you were given: Pain Booklet, Coughing and Deep Breathing and Surgical Site Infection Prevention

## 2013-08-29 MED ORDER — CEFAZOLIN SODIUM-DEXTROSE 2-3 GM-% IV SOLR
2.0000 g | INTRAVENOUS | Status: AC
  Administered 2013-08-30: 2 g via INTRAVENOUS
  Filled 2013-08-29: qty 50

## 2013-08-30 ENCOUNTER — Encounter (HOSPITAL_COMMUNITY): Payer: Self-pay | Admitting: *Deleted

## 2013-08-30 ENCOUNTER — Ambulatory Visit (HOSPITAL_COMMUNITY)
Admission: RE | Admit: 2013-08-30 | Discharge: 2013-08-30 | Disposition: A | Discharge status: Home / Self Care | Payer: 59 | Source: Ambulatory Visit | Attending: Surgery | Admitting: Surgery

## 2013-08-30 ENCOUNTER — Ambulatory Visit (HOSPITAL_COMMUNITY): Payer: 59 | Admitting: Anesthesiology

## 2013-08-30 ENCOUNTER — Ambulatory Visit (HOSPITAL_COMMUNITY): Payer: 59

## 2013-08-30 ENCOUNTER — Encounter (HOSPITAL_COMMUNITY)
Admission: RE | Disposition: A | Discharge status: Home / Self Care | Payer: Self-pay | Source: Ambulatory Visit | Attending: Surgery

## 2013-08-30 DIAGNOSIS — R599 Enlarged lymph nodes, unspecified: Secondary | ICD-10-CM

## 2013-08-30 DIAGNOSIS — K219 Gastro-esophageal reflux disease without esophagitis: Secondary | ICD-10-CM | POA: Insufficient documentation

## 2013-08-30 DIAGNOSIS — D1739 Benign lipomatous neoplasm of skin and subcutaneous tissue of other sites: Secondary | ICD-10-CM

## 2013-08-30 DIAGNOSIS — I10 Essential (primary) hypertension: Secondary | ICD-10-CM | POA: Insufficient documentation

## 2013-08-30 DIAGNOSIS — R222 Localized swelling, mass and lump, trunk: Secondary | ICD-10-CM | POA: Diagnosis present

## 2013-08-30 DIAGNOSIS — F17200 Nicotine dependence, unspecified, uncomplicated: Secondary | ICD-10-CM | POA: Insufficient documentation

## 2013-08-30 DIAGNOSIS — D1779 Benign lipomatous neoplasm of other sites: Secondary | ICD-10-CM | POA: Insufficient documentation

## 2013-08-30 HISTORY — PX: NECK SURGERY: SHX720

## 2013-08-30 HISTORY — PX: MASS EXCISION: SHX2000

## 2013-08-30 SURGERY — EXCISION MASS
Anesthesia: General

## 2013-08-30 MED ORDER — ONDANSETRON HCL 4 MG/2ML IJ SOLN: 4.0000 mg | Freq: Four times a day (QID) | INTRAMUSCULAR | Status: DC | PRN

## 2013-08-30 MED ORDER — SODIUM CHLORIDE 0.9 % IJ SOLN: 3.0000 mL | Freq: Two times a day (BID) | INTRAMUSCULAR | Status: DC

## 2013-08-30 MED ORDER — BUPIVACAINE-EPINEPHRINE (PF) 0.25% -1:200000 IJ SOLN
INTRAMUSCULAR | Status: AC
  Filled 2013-08-30: qty 30

## 2013-08-30 MED ORDER — CHLORHEXIDINE GLUCONATE 4 % EX LIQD: 1.0000 "application " | Freq: Once | CUTANEOUS | Status: DC

## 2013-08-30 MED ORDER — DEXAMETHASONE SODIUM PHOSPHATE 4 MG/ML IJ SOLN
INTRAMUSCULAR | Status: DC | PRN
  Administered 2013-08-30: 4 mg via INTRAVENOUS

## 2013-08-30 MED ORDER — ACETAMINOPHEN 650 MG RE SUPP: 650.0000 mg | RECTAL | Status: DC | PRN

## 2013-08-30 MED ORDER — ACETAMINOPHEN 325 MG PO TABS: 650.0000 mg | ORAL_TABLET | ORAL | Status: DC | PRN

## 2013-08-30 MED ORDER — HYDROMORPHONE HCL PF 1 MG/ML IJ SOLN: 0.2500 mg | INTRAMUSCULAR | Status: DC | PRN

## 2013-08-30 MED ORDER — SODIUM CHLORIDE 0.9 % IJ SOLN: 3.0000 mL | INTRAMUSCULAR | Status: DC | PRN

## 2013-08-30 MED ORDER — OXYCODONE HCL 5 MG/5ML PO SOLN: 5.0000 mg | Freq: Once | ORAL | Status: DC | PRN

## 2013-08-30 MED ORDER — MIDAZOLAM HCL 5 MG/5ML IJ SOLN
INTRAMUSCULAR | Status: DC | PRN
  Administered 2013-08-30: 2 mg via INTRAVENOUS

## 2013-08-30 MED ORDER — OXYCODONE HCL 5 MG PO TABS: 5.0000 mg | ORAL_TABLET | Freq: Once | ORAL | Status: DC | PRN

## 2013-08-30 MED ORDER — OXYCODONE HCL 5 MG PO TABS
5.0000 mg | ORAL_TABLET | Freq: Four times a day (QID) | ORAL | Status: DC | PRN
Start: 2013-08-30 — End: 2013-10-19

## 2013-08-30 MED ORDER — OXYCODONE HCL 5 MG PO TABS: 5.0000 mg | ORAL_TABLET | ORAL | Status: DC | PRN

## 2013-08-30 MED ORDER — PROPOFOL 10 MG/ML IV BOLUS
INTRAVENOUS | Status: DC | PRN
  Administered 2013-08-30: 200 mg via INTRAVENOUS

## 2013-08-30 MED ORDER — ROCURONIUM BROMIDE 100 MG/10ML IV SOLN
INTRAVENOUS | Status: DC | PRN
  Administered 2013-08-30: 40 mg via INTRAVENOUS

## 2013-08-30 MED ORDER — FENTANYL CITRATE 0.05 MG/ML IJ SOLN: 25.0000 ug | INTRAMUSCULAR | Status: DC | PRN

## 2013-08-30 MED ORDER — ONDANSETRON HCL 4 MG/2ML IJ SOLN: 4.0000 mg | Freq: Once | INTRAMUSCULAR | Status: DC | PRN

## 2013-08-30 MED ORDER — ACETAMINOPHEN 500 MG PO TABS: 1000.0000 mg | ORAL_TABLET | Freq: Three times a day (TID) | ORAL | Status: DC

## 2013-08-30 MED ORDER — ONDANSETRON HCL 4 MG/2ML IJ SOLN
INTRAMUSCULAR | Status: DC | PRN
  Administered 2013-08-30: 4 mg via INTRAVENOUS

## 2013-08-30 MED ORDER — SODIUM CHLORIDE 0.9 % IV SOLN: 250.0000 mL | INTRAVENOUS | Status: DC | PRN

## 2013-08-30 MED ORDER — BUPIVACAINE-EPINEPHRINE 0.25% -1:200000 IJ SOLN
INTRAMUSCULAR | Status: DC | PRN
  Administered 2013-08-30: 60 mL

## 2013-08-30 MED ORDER — KETOROLAC TROMETHAMINE 30 MG/ML IJ SOLN
INTRAMUSCULAR | Status: DC | PRN
  Administered 2013-08-30: 30 mg via INTRAVENOUS

## 2013-08-30 MED ORDER — LACTATED RINGERS IV SOLN
INTRAVENOUS | Status: DC
  Administered 2013-08-30 (×2): via INTRAVENOUS

## 2013-08-30 MED ORDER — LIDOCAINE HCL (CARDIAC) 20 MG/ML IV SOLN
INTRAVENOUS | Status: DC | PRN
  Administered 2013-08-30: 60 mg via INTRAVENOUS

## 2013-08-30 MED ORDER — GLYCOPYRROLATE 0.2 MG/ML IJ SOLN
INTRAMUSCULAR | Status: DC | PRN
  Administered 2013-08-30: 0.6 mg via INTRAVENOUS

## 2013-08-30 MED ORDER — FENTANYL CITRATE 0.05 MG/ML IJ SOLN
INTRAMUSCULAR | Status: DC | PRN
  Administered 2013-08-30 (×2): 50 ug via INTRAVENOUS
  Administered 2013-08-30: 100 ug via INTRAVENOUS
  Administered 2013-08-30: 50 ug via INTRAVENOUS

## 2013-08-30 MED ORDER — 0.9 % SODIUM CHLORIDE (POUR BTL) OPTIME
TOPICAL | Status: DC | PRN
  Administered 2013-08-30: 1000 mL

## 2013-08-30 MED ORDER — NEOSTIGMINE METHYLSULFATE 1 MG/ML IJ SOLN
INTRAMUSCULAR | Status: DC | PRN
  Administered 2013-08-30: 4 mg via INTRAVENOUS

## 2013-08-30 SURGICAL SUPPLY — 45 items
BENZOIN TINCTURE PRP APPL 2/3 (GAUZE/BANDAGES/DRESSINGS) ×4 IMPLANT
BLADE SURG 10 STRL SS (BLADE) ×2 IMPLANT
BLADE SURG 15 STRL LF DISP TIS (BLADE) ×1 IMPLANT
BLADE SURG 15 STRL SS (BLADE) ×1
CANISTER SUCTION 2500CC (MISCELLANEOUS) IMPLANT
CLSR STERI-STRIP ANTIMIC 1/2X4 (GAUZE/BANDAGES/DRESSINGS) ×2 IMPLANT
COVER SURGICAL LIGHT HANDLE (MISCELLANEOUS) ×2 IMPLANT
DECANTER SPIKE VIAL GLASS SM (MISCELLANEOUS) ×2 IMPLANT
DRAIN CHANNEL 15F RND FF W/TCR (WOUND CARE) ×2 IMPLANT
DRAPE LAPAROSCOPIC ABDOMINAL (DRAPES) IMPLANT
DRAPE PED LAPAROTOMY (DRAPES) IMPLANT
DRSG TEGADERM 4X4.75 (GAUZE/BANDAGES/DRESSINGS) ×6 IMPLANT
ELECT CAUTERY BLADE 6.4 (BLADE) ×2 IMPLANT
ELECT REM PT RETURN 9FT ADLT (ELECTROSURGICAL) ×2
ELECTRODE REM PT RTRN 9FT ADLT (ELECTROSURGICAL) ×1 IMPLANT
EVACUATOR SILICONE 100CC (DRAIN) ×2 IMPLANT
GLOVE BIOGEL PI IND STRL 8 (GLOVE) ×1 IMPLANT
GLOVE BIOGEL PI INDICATOR 8 (GLOVE) ×1
GLOVE ECLIPSE 8.0 STRL XLNG CF (GLOVE) ×2 IMPLANT
GOWN STRL NON-REIN LRG LVL3 (GOWN DISPOSABLE) ×2 IMPLANT
GOWN STRL REIN XL XLG (GOWN DISPOSABLE) ×2 IMPLANT
KIT BASIN OR (CUSTOM PROCEDURE TRAY) ×2 IMPLANT
KIT ROOM TURNOVER OR (KITS) ×2 IMPLANT
MARKER SKIN DUAL TIP RULER LAB (MISCELLANEOUS) ×2 IMPLANT
NEEDLE HYPO 25X1 1.5 SAFETY (NEEDLE) ×2 IMPLANT
NS IRRIG 1000ML POUR BTL (IV SOLUTION) ×2 IMPLANT
PACK SURGICAL SETUP 50X90 (CUSTOM PROCEDURE TRAY) ×2 IMPLANT
PAD ARMBOARD 7.5X6 YLW CONV (MISCELLANEOUS) ×4 IMPLANT
PENCIL BUTTON HOLSTER BLD 10FT (ELECTRODE) ×2 IMPLANT
SPECIMEN JAR SMALL (MISCELLANEOUS) ×2 IMPLANT
SPONGE GAUZE 4X4 12PLY (GAUZE/BANDAGES/DRESSINGS) ×2 IMPLANT
SPONGE LAP 18X18 X RAY DECT (DISPOSABLE) ×2 IMPLANT
STRIP CLOSURE SKIN 1/2X4 (GAUZE/BANDAGES/DRESSINGS) ×2 IMPLANT
SUT MNCRL AB 4-0 PS2 18 (SUTURE) ×4 IMPLANT
SUT SILK 2 0 SH (SUTURE) ×4 IMPLANT
SUT VIC AB 2-0 SH 18 (SUTURE) ×6 IMPLANT
SUT VIC AB 3-0 SH 27 (SUTURE) ×1
SUT VIC AB 3-0 SH 27XBRD (SUTURE) ×1 IMPLANT
SYR BULB 3OZ (MISCELLANEOUS) ×2 IMPLANT
SYR CONTROL 10ML LL (SYRINGE) ×2 IMPLANT
TAPE STRIPS DRAPE STRL (GAUZE/BANDAGES/DRESSINGS) ×4 IMPLANT
TOWEL OR 17X24 6PK STRL BLUE (TOWEL DISPOSABLE) ×2 IMPLANT
TOWEL OR 17X26 10 PK STRL BLUE (TOWEL DISPOSABLE) ×2 IMPLANT
TUBE CONNECTING 12X1/4 (SUCTIONS) IMPLANT
YANKAUER SUCT BULB TIP NO VENT (SUCTIONS) IMPLANT

## 2013-08-30 NOTE — Anesthesia Procedure Notes (Signed)
Procedure Name: Intubation Date/Time: 08/30/2013 2:22 PM Performed by: Elon Alas Pre-anesthesia Checklist: Patient identified, Timeout performed, Emergency Drugs available, Suction available and Patient being monitored Patient Re-evaluated:Patient Re-evaluated prior to inductionOxygen Delivery Method: Circle system utilized Preoxygenation: Pre-oxygenation with 100% oxygen Intubation Type: IV induction Ventilation: Mask ventilation without difficulty and Oral airway inserted - appropriate to patient size Laryngoscope Size: Mac and 4 Grade View: Grade III Tube type: Oral Tube size: 7.5 mm Number of attempts: 2 Airway Equipment and Method: Stylet Placement Confirmation: positive ETCO2,  ETT inserted through vocal cords under direct vision and breath sounds checked- equal and bilateral Secured at: 24 cm Tube secured with: Tape Dental Injury: Teeth and Oropharynx as per pre-operative assessment  Comments: DLx1 by paramedic student - esophageal intubation, DLx1 by CRNA - grade 3 view, 7.5 ETT passed atraumatically

## 2013-08-30 NOTE — Anesthesia Preprocedure Evaluation (Signed)
Anesthesia Evaluation  Patient identified by MRN, date of birth, ID band Patient awake    Reviewed: Allergy & Precautions, H&P , NPO status , Patient's Chart, lab work & pertinent test results  Airway Mallampati: I TM Distance: >3 FB Neck ROM: Full    Dental  (+) Teeth Intact and Dental Advisory Given   Pulmonary Current Smoker,  breath sounds clear to auscultation        Cardiovascular hypertension, Pt. on medications Rhythm:Regular Rate:Normal     Neuro/Psych    GI/Hepatic   Endo/Other    Renal/GU      Musculoskeletal   Abdominal   Peds  Hematology   Anesthesia Other Findings   Reproductive/Obstetrics                           Anesthesia Physical Anesthesia Plan  ASA: II  Anesthesia Plan: General   Post-op Pain Management:    Induction: Intravenous  Airway Management Planned: Oral ETT  Additional Equipment:   Intra-op Plan:   Post-operative Plan: Extubation in OR  Informed Consent: I have reviewed the patients History and Physical, chart, labs and discussed the procedure including the risks, benefits and alternatives for the proposed anesthesia with the patient or authorized representative who has indicated his/her understanding and acceptance.   Dental advisory given  Plan Discussed with: CRNA, Anesthesiologist and Surgeon  Anesthesia Plan Comments:         Anesthesia Quick Evaluation

## 2013-08-30 NOTE — Anesthesia Postprocedure Evaluation (Signed)
   Anesthesia Post-op Note  Patient: Crockett Frier  Procedure(s) Performed: Procedure(s): EXCISION NECK AND BACK MASS (N/A)  Patient Location: PACU  Anesthesia Type:General  Level of Consciousness: awake, alert , oriented and patient cooperative  Airway and Oxygen Therapy: Patient Spontanous Breathing and Patient connected to nasal cannula oxygen  Post-op Pain: mild  Post-op Assessment: Post-op Vital signs reviewed, Patient's Cardiovascular Status Stable, Respiratory Function Stable, Patent Airway, No signs of Nausea or vomiting and Pain level controlled  Post-op Vital Signs: Reviewed and stable  Complications: No apparent anesthesia complications

## 2013-08-30 NOTE — Transfer of Care (Signed)
Immediate Anesthesia Transfer of Care Note  Patient: Alec Mcguire  Procedure(s) Performed: Procedure(s): EXCISION NECK AND BACK MASS (N/A)  Patient Location: PACU  Anesthesia Type:General  Level of Consciousness: awake, alert  and oriented  Airway & Oxygen Therapy: Patient connected to nasal cannula oxygen  Post-op Assessment: Report given to PACU RN, Post -op Vital signs reviewed and stable and Patient moving all extremities X 4  Post vital signs: Reviewed and stable  Complications: No apparent anesthesia complications

## 2013-08-30 NOTE — Preoperative (Signed)
Beta Blockers   Reason not to administer Beta Blockers:Not Applicable 
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
## 2013-08-30 NOTE — Op Note (Signed)
08/30/2013  4:52 PM  PATIENT:  Terressa Koyanagi  57 y.o. male  Patient Care Team: Sissy Hoff, MD as PCP - General (Family Medicine)  PRE-OPERATIVE DIAGNOSIS:  neck mass and back mass  POST-OPERATIVE DIAGNOSIS:  neck mass and back mass  PROCEDURE:  Procedure(s): EXCISION Posterior NECK masses EXCISION OF Thoracic BACK MASS  SURGEON:  Surgeon(s): Ardeth Sportsman, MD  ASSISTANT: RN   ANESTHESIA:   local and general  EBL:  Total I/O In: 1200 [I.V.:1200] Out: 25 [Blood:25]  Delay start of Pharmacological VTE agent (>24hrs) due to surgical blood loss or risk of bleeding:  no  DRAINS: (15Fr) Blake drain(s) in the SQ tissues of thoracic back   SPECIMEN:  Source of Specimen:  1.  Posterior neck masses (probable LN)  2.  Back SQ mass  DISPOSITION OF SPECIMEN:  PATHOLOGY  COUNTS:  YES  PLAN OF CARE: Discharge to home after PACU  PATIENT DISPOSITION:  PACU - hemodynamically stable.  INDICATION:   Pleasant male with persistent sore posterior neck mass on the right side.  Also enlarging soft ellipsoid mass on his thoracic back between the shoulder blades.  Because they have become more symptomatic and larger, I recommended removal.  He agreed  The pathophysiology of skin & subcutaneous masses was discussed.  Natural history risks without surgery were discussed.  I recommended surgery to remove the mass.  I explained the technique of removal with use of local anesthesia & possible need for more aggressive sedation/anesthesia for patient comfort.    Risks such as bleeding, infection, heart attack, death, and other risks were discussed.  I noted a good likelihood this will help address the problem.   Possibility that this will not correct all symptoms was explained. Possibility of regrowth/recurrence of the mass was discussed.  We will work to minimize complications. Questions were answered.  The patient expresses understanding & wishes to proceed with surgery.  OR FINDINGS: 13x9x6cm  ellipsoid fatty mass on thoracic back - most likely lipoma.  Short stitch = superior margin.  Long single stitch =  Left lateral.  Looped stitch = deep margin.  Cluster of 5-15 mm masses c/w lymph nodes in posterior cervical neck chain  DESCRIPTION:   Informed consent was confirmed.   The patient received IV antibiotics and underwent general anesthesia without any difficulty. The patient was positioned prone.   The back & neck were prepped and draped in a sterile fashion.  A surgical timeout confirmed our plan.  I made a transverse 2 cm incision over the right posterior neck over the ellipsoid mass.  I went through the posterior neck and fascia.  I carefully dissected From the posterior neck muscles especially at the trapezius.  I was able to encounter a cluster of ellipsoid fatty fibrous masses consistent with a posterior cervical neck chain.  I excised the area primary using sharp dissection and focused cautery.  Small and hard lymph nodes were felt within these elliposoid fatty clusters  I carefully explored more deeply.  There is no abnormal tumors within the muscle itself.  I could feel the posterior spine.  I could fo feel no other abnormalities.  I assured hemostasis.  I packed the wound.  I turned attention to the posterior large back mass.  A made a 7 cm transverse incision that was extended to 10 cm.  I went through the skin and dermis.  I encountered a fibrous lipomatous mass densely adherent to the skin.  I created skin flaps in  all directions.  I gradually was able to come around the lobular fatty mass to the posterior back fascia.  Carefully freed off deeply.  I  eventually circumferentially mobilized the large posterior fatty mass off the skin flaps  I marked the mass as above.  Assure hemostasis.  I irrigated both wounds.  Hemostasis was good.  Of note I placed a drain exiting out the left posterior midclavicular line in the lower thoracic back.  I secured the drain with a 2-0 silk  stitch. I closed the wounds using 2-0 Vicryl interrupted deep dermal stitches.  I closed skin with 4-0 Monocryl stitch and Steri-Strips.   Sterile dressings were applied.  Patient's BX vigor to her room.  I discussed postop care in detail the patient and his wife.  Instructions are written.  I am about to locate the family again.

## 2013-09-01 ENCOUNTER — Encounter (HOSPITAL_COMMUNITY): Payer: Self-pay | Admitting: Surgery

## 2013-09-06 ENCOUNTER — Telehealth (INDEPENDENT_AMBULATORY_CARE_PROVIDER_SITE_OTHER): Payer: Self-pay

## 2013-09-06 DIAGNOSIS — Z86018 Personal history of other benign neoplasm: Secondary | ICD-10-CM

## 2013-09-06 MED ORDER — HYDROCODONE-ACETAMINOPHEN 5-325 MG PO TABS: 1.0000 | ORAL_TABLET | Freq: Four times a day (QID) | ORAL | Status: DC | PRN

## 2013-09-06 NOTE — Telephone Encounter (Signed)
Pt walked into office requesting refill on his pain medicine Oxycodone 5/325mg  and sleep medication. I advised pt that we would not prescribe anything for sleep that he would need to contact his medical doctor for that rx. I advised pt that Dr Michaell Cowing is out of town for the week so we will have to check with one of Dr Gordy Savers partners today. The pt is waiting in the lobby.  I asked Dr Dwain Sarna about refilling his Oxycodone 5/325mg . We will switch pt's rx to Norco 5/325mg  #20 per Dr Dwain Sarna. The pt is to not get any more pain medicine until pt is seen by Dr Michaell Cowing per Dr Dwain Sarna. I did check the pt's drainage amount that he has been recording since surgery and it's still too much to have drain removed. I wrote down instructions on the drainage sheet that once the drain puts out less than 30cc in 48hrs the pt can call to get a nurse only visit to have drain removed before the appt with Dr Michaell Cowing on 12/29. The pt understands.  I notified the pt of his pathology report as well that the masses are benign per Dr Michaell Cowing. I advised pt that we will give copy of path when he comes into his appt. On 12/29. The pt understands.

## 2013-09-06 NOTE — Telephone Encounter (Signed)
Pt calling in b/c the rx for Norco was only given for #2tabs when it should have been #20. Will rewrite the rx for the pt and he will come back to the office to p/u.

## 2013-09-09 ENCOUNTER — Ambulatory Visit (INDEPENDENT_AMBULATORY_CARE_PROVIDER_SITE_OTHER): Payer: 59

## 2013-09-09 NOTE — Progress Notes (Unsigned)
Patient  in for nurse only dressing change mid back and neck. Removed dressing Afebrile, no drainage, redness , odor, noted;  Steri streps intact back and neck. J.P drain in place 30 cc serous fluid noted. Cleansed J.P drain site with cholera prep . Applied 4x4 dressings to back and neck secured with tape. Patient tolerated well Patients family member educated on dressing change. Advised to call if temp 100.3 + or drainage or redness  Patient verbalized understanding

## 2013-09-18 ENCOUNTER — Other Ambulatory Visit (INDEPENDENT_AMBULATORY_CARE_PROVIDER_SITE_OTHER): Payer: Self-pay | Admitting: Surgery

## 2013-09-18 DIAGNOSIS — R222 Localized swelling, mass and lump, trunk: Secondary | ICD-10-CM

## 2013-09-18 MED ORDER — DOXYCYCLINE HYCLATE 100 MG PO TABS: 100.0000 mg | ORAL_TABLET | Freq: Two times a day (BID) | ORAL | Status: DC

## 2013-09-18 NOTE — Progress Notes (Signed)
POST-OPERATIVE DIAGNOSIS: neck mass and back mass   PROCEDURE: 08/30/2013:   EXCISION Posterior NECK masses  EXCISION OF Thoracic BACK MASS   SURGEON: Surgeon(s):  Ardeth Sportsman, MD      Pt's wife called concerned for foul odor coming out of drain where large back lipoma removed.  Apparently going on this last 7 days.  Wife concerned & called Pt denies worsening pain.  Mild numbness at post neck incision - stable Washing incision & drain site daily - they are clean.  Drain output tapering from 60, then 40 now ~64mL a day.  Due for office visit 12/29  I e-Rx'd doxycycline x 7d I rec'd he come to office Mon 12/22 when it is open to be evaluated & probably have drain removed vs seeing if incision needs to opened up.  They will call Mon AM.  I will ask office staff to check on pt that AM as well.  If he is worse over weekend, go to ED

## 2013-09-20 ENCOUNTER — Encounter (INDEPENDENT_AMBULATORY_CARE_PROVIDER_SITE_OTHER): Payer: Self-pay | Admitting: General Surgery

## 2013-09-20 ENCOUNTER — Telehealth (INDEPENDENT_AMBULATORY_CARE_PROVIDER_SITE_OTHER): Payer: Self-pay | Admitting: *Deleted

## 2013-09-20 ENCOUNTER — Ambulatory Visit (INDEPENDENT_AMBULATORY_CARE_PROVIDER_SITE_OTHER): Payer: 59 | Admitting: General Surgery

## 2013-09-20 VITALS — BP 180/86 | HR 88 | Temp 98.9°F | Resp 14 | Ht 67.0 in | Wt 177.0 lb

## 2013-09-20 DIAGNOSIS — Z4889 Encounter for other specified surgical aftercare: Secondary | ICD-10-CM

## 2013-09-20 NOTE — Telephone Encounter (Signed)
Patient called stating that he spoke to Dr. Michaell Cowing who wanted him seen this weekend or something to be called in for him.  Spoke to [place] who is going to look through all her messages and try to see what Dr. Michaell Cowing wanted him to do then we will give him a call back.  Patient states understanding and agreeable at this time.

## 2013-09-20 NOTE — Telephone Encounter (Signed)
Alec Mcguire found message from Dr. Michaell Cowing from 09/18/13 that stated "Pls call & check on pt Mon 12/22.  Needs to be seen in office to have drain removed & see if incision needs to be opened up."  Appt made for urgent office this afternoon.  Patient states understanding and agreeable with POC at this time.

## 2013-09-20 NOTE — Progress Notes (Signed)
Subjective:     Patient ID: Alec Mcguire, male   DOB: 09-21-1956, 57 y.o.   MRN: [id number]  HPI  He underwent excision of a neck mass and a back mass on December 1 by Dr. Michaell Cowing. A drain was left in. They were concerned about the drainage and the possibility of infection and he presents today to the urgent office for evaluation. No fever or chills. Drain output is between 20-30 cc a day.   Review of Systems     Objective:   Physical Exam Gen. A.-he looks well and is in no acute distress.  Back-well-healed incision in the upper back was some indentation. Drain is present inferior to this with serous drainage. I removed the drain sterilely. He tolerated it well.    Assessment:     Wound healing well with no evidence of infection. Drain has been removed.     Plan:     Drain wound care given to them. Continue light activity. Return visit in one week to see Dr. Michaell Cowing.

## 2013-09-20 NOTE — Patient Instructions (Signed)
Keep the bandage dry for 2 days then remove it. Clean the drain site area with peroxide and apply dry bandage to it until it is completely healed. Continue light activities until he see Dr. Michaell Cowing. He may shower and get the area wet in 2 days.

## 2013-09-27 ENCOUNTER — Ambulatory Visit (INDEPENDENT_AMBULATORY_CARE_PROVIDER_SITE_OTHER): Payer: 59 | Admitting: Surgery

## 2013-09-27 ENCOUNTER — Encounter (INDEPENDENT_AMBULATORY_CARE_PROVIDER_SITE_OTHER): Payer: Self-pay

## 2013-09-27 ENCOUNTER — Encounter (INDEPENDENT_AMBULATORY_CARE_PROVIDER_SITE_OTHER): Payer: Self-pay | Admitting: Surgery

## 2013-09-27 VITALS — BP 134/78 | HR 81 | Temp 98.0°F | Resp 18 | Ht 70.0 in | Wt 183.0 lb

## 2013-09-27 DIAGNOSIS — S301XXA Contusion of abdominal wall, initial encounter: Secondary | ICD-10-CM

## 2013-09-27 DIAGNOSIS — R599 Enlarged lymph nodes, unspecified: Secondary | ICD-10-CM

## 2013-09-27 DIAGNOSIS — R222 Localized swelling, mass and lump, trunk: Secondary | ICD-10-CM

## 2013-09-27 DIAGNOSIS — R229 Localized swelling, mass and lump, unspecified: Secondary | ICD-10-CM
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
## 2013-09-27 NOTE — Patient Instructions (Signed)
Lipoma A lipoma is a noncancerous (benign) tumor composed of fat cells. They are usually found under the skin (subcutaneous). A lipoma may occur in any tissue of the body that contains fat. Common areas for lipomas to appear include the back, shoulders, buttocks, and thighs. Lipomas are a very common soft tissue growth. They are soft and grow slowly. Most problems caused by a lipoma depend on where it is growing. DIAGNOSIS  A lipoma can be diagnosed with a physical exam. These tumors rarely become cancerous, but radiographic studies can help determine this for certain. Studies used may include:  Computerized X-ray scans (CT or CAT scan).  Computerized magnetic scans (MRI). TREATMENT  Small lipomas that are not causing problems may be watched. If a lipoma continues to enlarge or causes problems, removal is often the best treatment. Lipomas can also be removed to improve appearance. Surgery is done to remove the fatty cells and the surrounding capsule. Most often, this is done with medicine that numbs the area (local anesthetic). The removed tissue is examined under a microscope to make sure it is not cancerous. Keep all follow-up appointments with your caregiver. SEEK MEDICAL CARE IF:   The lipoma becomes larger or hard.  The lipoma becomes painful, red, or increasingly swollen. These could be signs of infection or a more serious condition. Document Released: 09/06/2002 Document Revised: 12/09/2011 Document Reviewed: 02/16/2010 [facility] Patient Information 2014 [place], [place].   Seroma A seroma is a collection of fluid that looks like swelling or a mass on the body. Seromas form on the body where tissue has been injured or cut. They are most common after surgeries. Seromas vary in size. Some are small and painless. Others may become large and cause pain or discomfort. Many seromas go away on their own; the fluid is naturally absorbed by the body. Some may require the fluid to be drained through  medical procedures.  CAUSES  Seromas form as the result of damage to tissue or the removal of tissue. This tissue damage may occur during surgery or because of an injury or trauma. When tissue is disrupted or removed, empty space is created. The body's natural defense system causes fluid to enter the empty space and form a seroma. SYMPTOMS   Swelling at the site of a surgical cut (incision) or an injury.  Drainage of clear fluid at the surgery or injury site.  Possible discomfort or pain. DIAGNOSIS  Your caregiver will perform a physical exam. During the exam, the caregiver will press on the seroma using a hand or fingers (palpation). Various tests may be ordered to help confirm the diagnosis. These tests may include:  Blood tests.  Imaging tests such as ultrasonography or computed tomography (CT). TREATMENT  Sometimes seromas resolve on their own and drain naturally in the body. Your caregiver may monitor you to make sure the seroma does not cause any complications. If your seroma does not resolve on its own, treatment may include:  Using a needle to drain the fluid from the seroma (needle aspiration).  Inserting a flexible tube (catheter) to drain the fluid.  Applying a dressing, such as an elastic bandage or binder.  Use of antibiotic medicines if the seroma becomes infected.  In rare cases, surgery may be done to remove the seroma and repair the area. HOME CARE INSTRUCTIONS  Follow your caregiver's instructions regarding activity levels and any limitations on movements.  Only take over-the-counter or prescription medicines as directed by your caregiver.  If your caregiver prescribes  antibiotics, take them as directed. Finish them even if you start to feel better.  Check your seroma every day for redness, warmth, or yellow drainage.  Follow up with your caregiver as directed. SEEK MEDICAL CARE IF:  You develop a fever.  You have pain, tenderness, redness, or warmth at  the site of the seroma.  You notice yellow drainage coming from the site of the seroma.  Your seroma is getting bigger. Document Released: 01/11/2013 Document Reviewed: 01/11/2013 [facility] Patient Information 2014 [place], [place].  GENERAL SURGERY: POST OP INSTRUCTIONS  1. DIET: Follow a light bland diet the first 24 hours after arrival home, such as soup, liquids, crackers, etc.  Be sure to include lots of fluids daily.  Avoid fast food or heavy meals as your are more likely to get nauseated.   2. Take your usually prescribed home medications unless otherwise directed. 3. PAIN CONTROL: a. Pain is best controlled by a usual combination of three different methods TOGETHER: i. Ice/Heat ii. Over the counter pain medication iii. Prescription pain medication b. Most patients will experience some swelling and bruising around the incisions.  Ice packs or heating pads (30-60 minutes up to 6 times a day) will help. Use ice for the first few days to help decrease swelling and bruising, then switch to heat to help relax tight/sore spots and speed recovery.  Some people prefer to use ice alone, heat alone, alternating between ice & heat.  Experiment to what works for you.  Swelling and bruising can take several weeks to resolve.   c. It is helpful to take an over-the-counter pain medication regularly for the first few weeks.  Choose one of the following that works best for you: i. Naproxen (Aleve, etc)  Two 220mg  tabs twice a day ii. Ibuprofen (Advil, etc) Three 200mg  tabs four times a day (every meal & bedtime) iii. Acetaminophen (Tylenol, etc) 500-650mg  four times a day (every meal & bedtime) d. A  prescription for pain medication (such as oxycodone, hydrocodone, etc) should be given to you upon discharge.  Take your pain medication as prescribed.  i. If you are having problems/concerns with the prescription medicine (does not control pain, nausea, vomiting, rash, itching, etc), please call us [phone]  [phone] to see if we need to switch you to a different pain medicine that will work better for you and/or control your side effect better. ii. If you need a refill on your pain medication, please contact your pharmacy.  They will contact our office to request authorization. Prescriptions will not be filled after 5 pm or on week-ends. 4. Avoid getting constipated.  Between the surgery and the pain medications, it is common to experience some constipation.  Increasing fluid intake and taking a fiber supplement (such as Metamucil, Citrucel, FiberCon, MiraLax, etc) 1-2 times a day regularly will usually help prevent this problem from occurring.  A mild laxative (prune juice, Milk of Magnesia, MiraLax, etc) should be taken according to package directions if there are no bowel movements after 48 hours.   5. Wash / shower every day.  You may shower over the dressings as they are waterproof.  Continue to shower over incision(s) after the dressing is off. 6. Remove your waterproof bandages 5 days after surgery.  You may leave the incision open to air.  You may have skin tapes (Steri Strips) covering the incision(s).  Leave them on until one week, then remove.  You may replace a dressing/Band-Aid to cover the incision for comfort if  you wish.      7. ACTIVITIES as tolerated:   a. You may resume regular (light) daily activities beginning the next day-such as daily self-care, walking, climbing stairs-gradually increasing activities as tolerated.  If you can walk 30 minutes without difficulty, it is safe to try more intense activity such as jogging, treadmill, bicycling, low-impact aerobics, swimming, etc. b. Save the most intensive and strenuous activity for last such as sit-ups, heavy lifting, contact sports, etc  Refrain from any heavy lifting or straining until you are off narcotics for pain control.   c. DO NOT PUSH THROUGH PAIN.  Let pain be your guide: If it hurts to do something, don't do it.  Pain is your  body warning you to avoid that activity for another week until the pain goes down. d. You may drive when you are no longer taking prescription pain medication, you can comfortably wear a seatbelt, and you can safely maneuver your car and apply brakes. e. Bonita Quin may have sexual intercourse when it is comfortable.  8. FOLLOW UP in our office a. Please call [facility] at [phone] to set up an appointment to see your surgeon in the office for a follow-up appointment approximately 2-3 weeks after your surgery. b. Make sure that you call for this appointment the day you arrive home to insure a convenient appointment time. 9. IF YOU HAVE DISABILITY OR FAMILY LEAVE FORMS, BRING THEM TO THE OFFICE FOR PROCESSING.  DO NOT GIVE THEM TO YOUR DOCTOR.   WHEN TO CALL us [phone]: 1. Poor pain control 2. Reactions / problems with new medications (rash/itching, nausea, etc)  3. Fever over 101.5 F (38.5 C) 4. Worsening swelling or bruising 5. Continued bleeding from incision. 6. Increased pain, redness, or drainage from the incision 7. Difficulty breathing / swallowing   The clinic staff is available to answer your questions during regular business hours (8:30am-5pm).  Please don't hesitate to call and ask to speak to one of our nurses for clinical concerns.   If you have a medical emergency, go to the nearest emergency room or call 911.  A surgeon from [facility] is always on call at the [facility], [place] [address], [address], [place], [place]  [address] ? MAIN: [phone] ? TOLL FREE: [phone] ?  FAX [phone] [email or web address]

## 2013-10-19 ENCOUNTER — Encounter (INDEPENDENT_AMBULATORY_CARE_PROVIDER_SITE_OTHER): Payer: Self-pay | Admitting: General Surgery

## 2013-10-19 ENCOUNTER — Ambulatory Visit (INDEPENDENT_AMBULATORY_CARE_PROVIDER_SITE_OTHER): Payer: 59 | Admitting: General Surgery

## 2013-10-19 VITALS — BP 140/78 | HR 80 | Temp 97.9°F | Resp 16 | Ht 67.0 in | Wt 187.2 lb

## 2013-10-19 DIAGNOSIS — IMO0002 Reserved for concepts with insufficient information to code with codable children: Secondary | ICD-10-CM

## 2013-10-19 NOTE — Progress Notes (Signed)
Alec Mcguire underwent excision of a lipomatous mass of the back 08/30/2013. It was a large mass. He developed a postoperative seroma which was drained in the office by Dr. Johney [place]. He is concerned that the foot is back and is interested in having it removed.  Exam:  On the upper back there is a transverse incision is clean and intact. There is no tenderness swelling. There is a small amount of swelling present consistent with a small seroma.  Impression: Postoperative seroma-it is not large nor is it tense.  Plan: I advised him not to have it aspirated because there's a small chance of introducing infection. I told him that if the swelling increased and became tense, to call back and arrange see Dr. Johney [place].

## 2013-10-19 NOTE — Patient Instructions (Signed)
If the area gets more swollen and tense, call for a return appointment with Dr. Johney [place].

## 2014-02-23 ENCOUNTER — Emergency Department (HOSPITAL_COMMUNITY)
Admission: EM | Admit: 2014-02-23 | Discharge: 2014-02-23 | Disposition: A | Discharge status: Home / Self Care | Payer: 59 | Source: Home / Self Care | Attending: Family Medicine | Admitting: Family Medicine

## 2014-02-23 ENCOUNTER — Encounter (HOSPITAL_COMMUNITY): Payer: Self-pay | Admitting: Emergency Medicine

## 2014-02-23 ENCOUNTER — Other Ambulatory Visit (HOSPITAL_COMMUNITY)
Admission: RE | Admit: 2014-02-23 | Discharge: 2014-02-23 | Disposition: A | Discharge status: Home / Self Care | Payer: 59 | Source: Ambulatory Visit | Attending: Family Medicine | Admitting: Family Medicine

## 2014-02-23 DIAGNOSIS — Z113 Encounter for screening for infections with a predominantly sexual mode of transmission: Secondary | ICD-10-CM | POA: Insufficient documentation

## 2014-02-23 DIAGNOSIS — A599 Trichomoniasis, unspecified: Secondary | ICD-10-CM

## 2014-02-23 MED ORDER — METRONIDAZOLE 500 MG PO TABS: 2000.0000 mg | ORAL_TABLET | Freq: Once | ORAL | Status: DC

## 2014-02-23 NOTE — Discharge Instructions (Signed)
Thank you for coming in today. Take metronidazole once.  Avoid alcohol for several days.  Come back as needed   Trichomoniasis Trichomoniasis is an infection, caused by the Trichomonas organism, that affects both women and men. In women, the outer male genitalia and the vagina are affected. In men, the penis is mainly affected, but the prostate and other reproductive organs can also be involved. Trichomoniasis is a sexually transmitted disease (STD) and is most often passed to another person through sexual contact. The majority of people who get trichomoniasis do so from a sexual encounter and are also at risk for other STDs. CAUSES   Sexual intercourse with an infected partner.  It can be present in swimming pools or hot tubs. SYMPTOMS   Abnormal gray-green frothy vaginal discharge in women.  Vaginal itching and irritation in women.  Itching and irritation of the area outside the vagina in women.  Penile discharge with or without pain in males.  Inflammation of the urethra (urethritis), causing painful urination.  Bleeding after sexual intercourse. RELATED COMPLICATIONS  Pelvic inflammatory disease.  Infection of the uterus (endometritis).  Infertility.  Tubal (ectopic) pregnancy.  It can be associated with other STDs, including gonorrhea and chlamydia, hepatitis B, and HIV. COMPLICATIONS DURING PREGNANCY  Early (premature) delivery.  Premature rupture of the membranes (PROM).  Low birth weight. DIAGNOSIS   Visualization of Trichomonas under the microscope from the vagina discharge.  Ph of the vagina greater than 4.5, tested with a test tape.  Trich Rapid Test.  Culture of the organism, but this is not usually needed.  It may be found on a Pap test.  Having a "strawberry cervix,"which means the cervix looks very red like a strawberry. TREATMENT   You may be given medication to fight the infection. Inform your caregiver if you could be or are pregnant.  Some medications used to treat the infection should not be taken during pregnancy.  Over-the-counter medications or creams to decrease itching or irritation may be recommended.  Your sexual partner will need to be treated if infected. HOME CARE INSTRUCTIONS   Take all medication prescribed by your caregiver.  Take over-the-counter medication for itching or irritation as directed by your caregiver.  Do not have sexual intercourse while you have the infection.  Do not douche or wear tampons.  Discuss your infection with your partner, as your partner may have acquired the infection from you. Or, your partner may have been the person who transmitted the infection to you.  Have your sex partner examined and treated if necessary.  Practice safe, informed, and protected sex.  See your caregiver for other STD testing. SEEK MEDICAL CARE IF:   You still have symptoms after you finish the medication.  You have an oral temperature above 102 F (38.9 C).  You develop belly (abdominal) pain.  You have pain when you urinate.  You have bleeding after sexual intercourse.  You develop a rash.  The medication makes you sick or makes you throw up (vomit). Document Released: 03/12/2001 Document Revised: 12/09/2011 Document Reviewed: 04/07/2009 [facility] Patient Information 2014 [place], [place].
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
## 2014-02-23 NOTE — ED Notes (Signed)
Pt is here to be screened for STD Reports partner is being treated for Trich He is asymptomatic Alert w/no signs of acute distress.

## 2014-03-03 NOTE — ED Notes (Signed)
Labs positive for trichomonas, negative for GC or chlamydia, treatment approp

## 2014-03-14 ENCOUNTER — Telehealth (HOSPITAL_COMMUNITY): Payer: Self-pay | Admitting: *Deleted

## 2014-03-14 NOTE — ED Notes (Signed)
I called pt. and left a message to call.  Pt. called back and verified x 2. Pt. given results and told he was adequately treated for Trich with Flagyl.  Pt. instructed to notify his partner to be treated with Flagyl, no sex until his partner has been treated and to practice safe sex. Pt. voiced understanding. Roselyn Meier 03/14/2014

## 2014-03-31 ENCOUNTER — Encounter (HOSPITAL_COMMUNITY): Payer: Self-pay | Admitting: Emergency Medicine

## 2014-03-31 ENCOUNTER — Emergency Department (HOSPITAL_COMMUNITY)
Admission: EM | Admit: 2014-03-31 | Discharge: 2014-03-31 | Disposition: A | Discharge status: Home / Self Care | Payer: 59 | Source: Home / Self Care | Attending: Family Medicine | Admitting: Family Medicine

## 2014-03-31 ENCOUNTER — Other Ambulatory Visit (HOSPITAL_COMMUNITY)
Admission: RE | Admit: 2014-03-31 | Discharge: 2014-03-31 | Disposition: A | Discharge status: Home / Self Care | Payer: 59 | Source: Ambulatory Visit | Attending: Family Medicine | Admitting: Family Medicine

## 2014-03-31 DIAGNOSIS — A599 Trichomoniasis, unspecified: Secondary | ICD-10-CM

## 2014-03-31 DIAGNOSIS — Z113 Encounter for screening for infections with a predominantly sexual mode of transmission: Secondary | ICD-10-CM | POA: Insufficient documentation
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
## 2014-03-31 NOTE — ED Notes (Signed)
Diagnosed 3 weeks ago with Alec Mcguire and took all his Flagyl.  Partner was treated.  No symptoms, but just wants to be rechecked.

## 2014-03-31 NOTE — Discharge Instructions (Signed)
Thank you for coming in today. We will call you if anything comes back positive.   Trichomoniasis Trichomoniasis is an infection caused by an organism called Trichomonas. The infection can affect both women and men. In women, the outer male genitalia and the vagina are affected. In men, the penis is mainly affected, but the prostate and other reproductive organs can also be involved. Trichomoniasis is a sexually transmitted infection (STI) and is most often passed to another person through sexual contact.  RISK FACTORS  Having unprotected sexual intercourse.  Having sexual intercourse with an infected partner. SIGNS AND SYMPTOMS  Symptoms of trichomoniasis in women include:  Abnormal gray-green frothy vaginal discharge.  Itching and irritation of the vagina.  Itching and irritation of the area outside the vagina. Symptoms of trichomoniasis in men include:   Penile discharge with or without pain.  Pain during urination. This results from inflammation of the urethra. DIAGNOSIS  Trichomoniasis may be found during a Pap test or physical exam. Your health care provider may use one of the following methods to help diagnose this infection:  Examining vaginal discharge under a microscope. For men, urethral discharge would be examined.  Testing the pH of the vagina with a test tape.  Using a vaginal swab test that checks for the Trichomonas organism. A test is available that provides results within a few minutes.  Doing a culture test for the organism. This is not usually needed. TREATMENT   You may be given medicine to fight the infection. Women should inform their health care provider if they could be or are pregnant. Some medicines used to treat the infection should not be taken during pregnancy.  Your health care provider may recommend over-the-counter medicines or creams to decrease itching or irritation.  Your sexual partner will need to be treated if infected. HOME CARE  INSTRUCTIONS   Take all medicine prescribed by your health care provider.  Take over-the-counter medicine for itching or irritation as directed by your health care provider.  Do not have sexual intercourse while you have the infection.  Women should not douche or wear tampons while they have the infection.  Discuss your infection with your partner. Your partner may have gotten the infection from you, or you may have gotten it from your partner.  Have your sex partner get examined and treated if necessary.  Practice safe, informed, and protected sex.  See your health care provider for other STI testing. SEEK MEDICAL CARE IF:   You still have symptoms after you finish your medicine.  You develop abdominal pain.  You have pain when you urinate.  You have bleeding after sexual intercourse.  You develop a rash.  Your medicine makes you sick or makes you throw up (vomit). Document Released: 03/12/2001 Document Revised: 09/21/2013 Document Reviewed: 06/28/2013 [facility] Patient Information 2015 [place], [place]. This information is not intended to replace advice given to you by your health care provider. Make sure you discuss any questions you have with your health care provider.

## 2014-04-07 NOTE — ED Notes (Signed)
Pt. called on VM 7/7@ 1755 and wanted to know what his diagnosis was.  7/8 Pt. came to [facility] for copy of his labs. Labs copied and put in brown envelope and given to [place], while pt.was filling out his medical release form. Roselyn Meier 04/07/2014

## 2014-04-12 ENCOUNTER — Encounter (HOSPITAL_COMMUNITY): Payer: Self-pay | Admitting: Emergency Medicine

## 2014-04-12 ENCOUNTER — Inpatient Hospital Stay (HOSPITAL_COMMUNITY)
Admission: EM | Admit: 2014-04-12 | Discharge: 2014-04-14 | DRG: 916 | Disposition: A | Discharge status: Home / Self Care | Payer: 59 | Attending: Internal Medicine | Admitting: Internal Medicine

## 2014-04-12 DIAGNOSIS — Z720 Tobacco use: Secondary | ICD-10-CM

## 2014-04-12 DIAGNOSIS — Z888 Allergy status to other drugs, medicaments and biological substances status: Secondary | ICD-10-CM

## 2014-04-12 DIAGNOSIS — Y92009 Unspecified place in unspecified non-institutional (private) residence as the place of occurrence of the external cause: Secondary | ICD-10-CM

## 2014-04-12 DIAGNOSIS — K219 Gastro-esophageal reflux disease without esophagitis: Secondary | ICD-10-CM | POA: Diagnosis present

## 2014-04-12 DIAGNOSIS — T783XXA Angioneurotic edema, initial encounter: Principal | ICD-10-CM | POA: Diagnosis present

## 2014-04-12 DIAGNOSIS — I10 Essential (primary) hypertension: Secondary | ICD-10-CM

## 2014-04-12 DIAGNOSIS — T783XXD Angioneurotic edema, subsequent encounter: Secondary | ICD-10-CM

## 2014-04-12 DIAGNOSIS — Z79899 Other long term (current) drug therapy: Secondary | ICD-10-CM

## 2014-04-12 DIAGNOSIS — F17200 Nicotine dependence, unspecified, uncomplicated: Secondary | ICD-10-CM | POA: Diagnosis present

## 2014-04-12 DIAGNOSIS — G40909 Epilepsy, unspecified, not intractable, without status epilepticus: Secondary | ICD-10-CM | POA: Diagnosis present

## 2014-04-12 DIAGNOSIS — Z833 Family history of diabetes mellitus: Secondary | ICD-10-CM

## 2014-04-12 DIAGNOSIS — T46905A Adverse effect of unspecified agents primarily affecting the cardiovascular system, initial encounter: Secondary | ICD-10-CM | POA: Diagnosis present

## 2014-04-12 LAB — CBC
HCT: 36.2 % — ABNORMAL LOW (ref 39.0–52.0)
Hemoglobin: 11.7 g/dL — ABNORMAL LOW (ref 13.0–17.0)
MCH: 24.2 pg — ABNORMAL LOW (ref 26.0–34.0)
MCHC: 32.3 g/dL (ref 30.0–36.0)
MCV: 74.9 fL — ABNORMAL LOW (ref 78.0–100.0)
PLATELETS: 190 10*3/uL (ref 150–400)
RBC: 4.83 MIL/uL (ref 4.22–5.81)
RDW: 15.2 % (ref 11.5–15.5)
WBC: 7.3 10*3/uL (ref 4.0–10.5)

## 2014-04-12 LAB — I-STAT CHEM 8, ED
BUN: 19 mg/dL (ref 6–23)
CHLORIDE: 101 meq/L (ref 96–112)
Calcium, Ion: 1.19 mmol/L (ref 1.12–1.23)
Creatinine, Ser: 1.1 mg/dL (ref 0.50–1.35)
Glucose, Bld: 204 mg/dL — ABNORMAL HIGH (ref 70–99)
HEMATOCRIT: 44 % (ref 39.0–52.0)
HEMOGLOBIN: 15 g/dL (ref 13.0–17.0)
POTASSIUM: 4.2 meq/L (ref 3.7–5.3)
SODIUM: 134 meq/L — AB (ref 137–147)
TCO2: 23 mmol/L (ref 0–100)

## 2014-04-12 MED ORDER — DIPHENHYDRAMINE HCL 50 MG/ML IJ SOLN
50.0000 mg | Freq: Once | INTRAMUSCULAR | Status: AC
  Administered 2014-04-12: 50 mg via INTRAVENOUS
  Filled 2014-04-12: qty 1

## 2014-04-12 MED ORDER — METHYLPREDNISOLONE SODIUM SUCC 125 MG IJ SOLR
125.0000 mg | Freq: Once | INTRAMUSCULAR | Status: AC
  Administered 2014-04-12: 125 mg via INTRAVENOUS
  Filled 2014-04-12: qty 2

## 2014-04-12 MED ORDER — FAMOTIDINE IN NACL 20-0.9 MG/50ML-% IV SOLN
20.0000 mg | Freq: Once | INTRAVENOUS | Status: AC
  Administered 2014-04-12: 20 mg via INTRAVENOUS
  Filled 2014-04-12: qty 50

## 2014-04-12 NOTE — ED Notes (Signed)
Pt states that he thinks that swelling has gotten worse. Denies any swelling to tongue or throat. MD [facility] made aware, at bedside assessing. Pts airway intact. 98% on RA.

## 2014-04-12 NOTE — ED Notes (Signed)
Pt has swelling to jaw and top and bottom lips. Pt denies swelling to tongue or airway. Denies SOB. NAD.

## 2014-04-12 NOTE — ED Notes (Signed)
Presents with lower lip and jaw edema began this AM after taking lisinopril, went to his doctor and given a shot and benadryl, went home and took a nap and woke up this evening with worse lower jaw swelling. Airway intact, no stridor, Tongue WNL. Breath sounds clear.
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
## 2014-04-13 ENCOUNTER — Encounter (HOSPITAL_COMMUNITY): Payer: Self-pay | Admitting: *Deleted

## 2014-04-13 DIAGNOSIS — T783XXA Angioneurotic edema, initial encounter: Principal | ICD-10-CM

## 2014-04-13 DIAGNOSIS — Z5189 Encounter for other specified aftercare: Secondary | ICD-10-CM

## 2014-04-13 DIAGNOSIS — F17200 Nicotine dependence, unspecified, uncomplicated: Secondary | ICD-10-CM

## 2014-04-13 DIAGNOSIS — I10 Essential (primary) hypertension: Secondary | ICD-10-CM | POA: Diagnosis present

## 2014-04-13 LAB — CBC
HCT: 35.2 % — ABNORMAL LOW (ref 39.0–52.0)
Hemoglobin: 11.4 g/dL — ABNORMAL LOW (ref 13.0–17.0)
MCH: 24.2 pg — AB (ref 26.0–34.0)
MCHC: 32.4 g/dL (ref 30.0–36.0)
MCV: 74.6 fL — ABNORMAL LOW (ref 78.0–100.0)
PLATELETS: 174 10*3/uL (ref 150–400)
RBC: 4.72 MIL/uL (ref 4.22–5.81)
RDW: 15.2 % (ref 11.5–15.5)
WBC: 5 10*3/uL (ref 4.0–10.5)

## 2014-04-13 LAB — BASIC METABOLIC PANEL
ANION GAP: 14 (ref 5–15)
BUN: 20 mg/dL (ref 6–23)
CO2: 23 mEq/L (ref 19–32)
CREATININE: 0.97 mg/dL (ref 0.50–1.35)
Calcium: 9.4 mg/dL (ref 8.4–10.5)
Chloride: 97 mEq/L (ref 96–112)
Glucose, Bld: 176 mg/dL — ABNORMAL HIGH (ref 70–99)
POTASSIUM: 4.9 meq/L (ref 3.7–5.3)
Sodium: 134 mEq/L — ABNORMAL LOW (ref 137–147)

## 2014-04-13 MED ORDER — METHYLPREDNISOLONE SODIUM SUCC 125 MG IJ SOLR
80.0000 mg | Freq: Once | INTRAMUSCULAR | Status: AC
  Administered 2014-04-13: 80 mg via INTRAVENOUS
  Filled 2014-04-13: qty 1.28

## 2014-04-13 MED ORDER — ACETAMINOPHEN 325 MG PO TABS
650.0000 mg | ORAL_TABLET | Freq: Four times a day (QID) | ORAL | Status: DC | PRN
  Administered 2014-04-13: 650 mg via ORAL
  Filled 2014-04-13: qty 2

## 2014-04-13 MED ORDER — ACETAMINOPHEN 650 MG RE SUPP: 650.0000 mg | Freq: Four times a day (QID) | RECTAL | Status: DC | PRN

## 2014-04-13 MED ORDER — DIPHENHYDRAMINE HCL 50 MG/ML IJ SOLN: 25.0000 mg | Freq: Four times a day (QID) | INTRAMUSCULAR | Status: DC | PRN

## 2014-04-13 MED ORDER — LORATADINE 10 MG PO TABS
10.0000 mg | ORAL_TABLET | Freq: Every day | ORAL | Status: DC
  Administered 2014-04-13 – 2014-04-14 (×2): 10 mg via ORAL
  Filled 2014-04-13 (×2): qty 1

## 2014-04-13 MED ORDER — HYDROXYZINE HCL 50 MG/ML IM SOLN
25.0000 mg | Freq: Four times a day (QID) | INTRAMUSCULAR | Status: DC
  Filled 2014-04-13 (×3): qty 0.5

## 2014-04-13 MED ORDER — AMLODIPINE BESYLATE 5 MG PO TABS
5.0000 mg | ORAL_TABLET | Freq: Every day | ORAL | Status: DC
  Administered 2014-04-13 – 2014-04-14 (×2): 5 mg via ORAL
  Filled 2014-04-13 (×3): qty 1

## 2014-04-13 MED ORDER — ONDANSETRON HCL 4 MG PO TABS: 4.0000 mg | ORAL_TABLET | Freq: Four times a day (QID) | ORAL | Status: DC | PRN

## 2014-04-13 MED ORDER — HYDROMORPHONE HCL PF 1 MG/ML IJ SOLN: 0.5000 mg | INTRAMUSCULAR | Status: DC | PRN

## 2014-04-13 MED ORDER — ONDANSETRON HCL 4 MG/2ML IJ SOLN: 4.0000 mg | Freq: Four times a day (QID) | INTRAMUSCULAR | Status: DC | PRN

## 2014-04-13 MED ORDER — HYDROXYZINE HCL 25 MG PO TABS
25.0000 mg | ORAL_TABLET | Freq: Three times a day (TID) | ORAL | Status: DC
  Administered 2014-04-13 – 2014-04-14 (×3): 25 mg via ORAL
  Filled 2014-04-13 (×7): qty 1

## 2014-04-13 MED ORDER — METHYLPREDNISOLONE SODIUM SUCC 40 MG IJ SOLR
40.0000 mg | Freq: Two times a day (BID) | INTRAMUSCULAR | Status: DC
  Administered 2014-04-13 – 2014-04-14 (×2): 40 mg via INTRAVENOUS
  Filled 2014-04-13 (×4): qty 1

## 2014-04-13 MED ORDER — OXYCODONE HCL 5 MG PO TABS: 5.0000 mg | ORAL_TABLET | ORAL | Status: DC | PRN

## 2014-04-13 MED ORDER — ENOXAPARIN SODIUM 40 MG/0.4ML ~~LOC~~ SOLN
40.0000 mg | SUBCUTANEOUS | Status: DC
  Administered 2014-04-13 – 2014-04-14 (×2): 40 mg via SUBCUTANEOUS
  Filled 2014-04-13 (×2): qty 0.4

## 2014-04-13 MED ORDER — HYDRALAZINE HCL 20 MG/ML IJ SOLN: 5.0000 mg | Freq: Four times a day (QID) | INTRAMUSCULAR | Status: DC | PRN

## 2014-04-13 MED ORDER — SODIUM CHLORIDE 0.9 % IV SOLN
INTRAVENOUS | Status: DC
  Administered 2014-04-13 (×2): via INTRAVENOUS

## 2014-04-13 MED ORDER — DIPHENHYDRAMINE HCL 50 MG/ML IJ SOLN
25.0000 mg | Freq: Four times a day (QID) | INTRAMUSCULAR | Status: DC
  Administered 2014-04-13 (×2): 25 mg via INTRAVENOUS
  Filled 2014-04-13 (×2): qty 1
  Filled 2014-04-13 (×2): qty 0.5

## 2014-04-13 MED ORDER — ALUM & MAG HYDROXIDE-SIMETH 200-200-20 MG/5ML PO SUSP
30.0000 mL | Freq: Four times a day (QID) | ORAL | Status: DC | PRN
Start: 2014-04-13 — End: 2014-04-14

## 2014-04-13 MED ORDER — FAMOTIDINE IN NACL 20-0.9 MG/50ML-% IV SOLN
20.0000 mg | Freq: Two times a day (BID) | INTRAVENOUS | Status: DC
  Administered 2014-04-13 (×2): 20 mg via INTRAVENOUS
  Filled 2014-04-13 (×4): qty 50

## 2014-04-13 NOTE — Progress Notes (Addendum)
[facility] Progress Note   [name] [id number]:[id number] DOB: [birth year] DOA: 04/12/2014 PCP: [name], MD  Brief narrative: Alec Mcguire is a 58 y.o. male  with a history of HTN and Seizures who presents to the ED with complaints of worsening swelling of his mouth and lips despite injection of IV steroid and oral Benadryl which was given at his PCP's office today.   Subjective: Feels that face and lip swelling is the same. No increase in edema and no involvement of throat.   Assessment/Plan: Principal Problem:   Angioedema of lips - due to Lisinopril - advised to stop - add Claritin - states Benadryl is making his swelling worse- doubt this but will switch to Hydroxizine - add Steroids - cont H2 blocker  Active Problems:    Essential hypertension, benign - his PCP has already prescribed Amlodipine for him in place of Lisinopril- will order this at 5 mg daily    Tobacco abuse - advised to quit smoking    Code Status: full code Family Communication: none Disposition Plan: home hopefully in AM  Consultants: none  Procedures: none  Antibiotics: Anti-infectives   None       DVT prophylaxis: Lovenox  Objective: Filed Weights   04/12/14 1701 04/13/14 0126  Weight: 75.751 kg (167 lb) 77.248 kg (170 lb 4.8 oz)    Vitals Filed Vitals:   04/13/14 0045 04/13/14 0126 04/13/14 0500 04/13/14 0745  BP:  119/58 133/62 123/66  Pulse: 70 67 63 69  Temp:  97.8 F (36.6 C) 97.9 F (36.6 C) 97.9 F (36.6 C)  TempSrc:  Oral Oral Oral  Resp: 17 18 18 18   Height:  5\' 7"  (1.702 m)    Weight:  77.248 kg (170 lb 4.8 oz)    SpO2: 100% 97% 96% 96%     No intake or output data in the 24 hours ending 04/13/14 1028   Exam: General: No acute respiratory distress- face and lips swollen.  Lungs: Clear to auscultation bilaterally without wheezes or crackles Cardiovascular: Regular rate and rhythm without murmur gallop or rub normal S1 and S2 Abdomen: Nontender,  nondistended, soft, bowel sounds positive, no rebound, no ascites, no appreciable mass Extremities: No significant cyanosis, clubbing, or edema bilateral lower extremities  Data Reviewed: Basic Metabolic Panel:  Recent Labs Lab 04/12/14 2304 04/13/14 0400  NA 134* 134*  K 4.2 4.9  CL 101 97  CO2  --  23  GLUCOSE 204* 176*  BUN 19 20  CREATININE 1.10 0.97  CALCIUM  --  9.4   Liver Function Tests: No results found for this basename: AST, ALT, ALKPHOS, BILITOT, PROT, ALBUMIN,  in the last 168 hours No results found for this basename: LIPASE, AMYLASE,  in the last 168 hours No results found for this basename: AMMONIA,  in the last 168 hours CBC:  Recent Labs Lab 04/12/14 2257 04/12/14 2304 04/13/14 0400  WBC 7.3  --  5.0  HGB 11.7* 15.0 11.4*  HCT 36.2* 44.0 35.2*  MCV 74.9*  --  74.6*  PLT 190  --  174   Cardiac Enzymes: No results found for this basename: CKTOTAL, CKMB, CKMBINDEX, TROPONINI,  in the last 168 hours BNP (last 3 results) No results found for this basename: PROBNP,  in the last 8760 hours CBG: No results found for this basename: GLUCAP,  in the last 168 hours  No results found for this or any previous visit (from the past 240 hour(s)).   Studies:  Recent x-ray  studies have been reviewed in detail by the Attending Physician  Scheduled Meds:  Scheduled Meds: . diphenhydrAMINE  25 mg Intravenous Q6H  . enoxaparin (LOVENOX) injection  40 mg Subcutaneous Q24H  . famotidine (PEPCID) IV  20 mg Intravenous Q12H  . loratadine  10 mg Oral Daily   Continuous Infusions: . sodium chloride 10 mL/hr at 04/13/14 0129    Time spent on care of this patient: 25 min   [place], MD 04/13/2014, 10:28 AM  LOS: 1 day   [facility] Office  [phone] Pager - Text Page per Shea Evans   If 7PM-7AM, please contact night-coverage [email or web address]

## 2014-04-13 NOTE — Progress Notes (Signed)
Patient's blood pressure 168/74, HR-67 notified Dr. Wynelle Cleveland who ordered Norvasc. Will continue assess patient.  Patient also refused Benadryl stated it makes his face swell, informed MD, benadryl D/C'd and started patient on  Atarax.
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
## 2014-04-14 MED ORDER — HYDROXYZINE HCL 25 MG PO TABS: 25.0000 mg | ORAL_TABLET | Freq: Three times a day (TID) | ORAL | Status: AC

## 2014-04-14 MED ORDER — FAMOTIDINE 20 MG PO TABS: 20.0000 mg | ORAL_TABLET | Freq: Two times a day (BID) | ORAL | Status: AC

## 2014-04-14 MED ORDER — LORATADINE 10 MG PO TABS: 10.0000 mg | ORAL_TABLET | Freq: Every day | ORAL | Status: AC

## 2014-04-14 NOTE — Progress Notes (Signed)
Patient discharge teaching given, including activity, diet, follow-up appoints, and medications. Patient verbalized understanding of all discharge instructions. IV access was d/c'd. Vitals are stable. Skin is intact except as charted in most recent assessments. Pt to be escorted out by NT, to be driven home by family.  Tonilynn Bieker, MBA, BS, RN 

## 2014-04-14 NOTE — Progress Notes (Signed)
Utilization review completed. Tajay Muzzy, RN, BSN. 

## 2014-04-14 NOTE — Discharge Summary (Signed)
Physician Discharge Summary  Alec Mcguire YBO:[id number] DOB: 11/01/1955 DOA: 04/12/2014  PCP: [name], MD  Admit date: 04/12/2014 Discharge date: 04/14/2014  Time spent:>45 minutes   Discharge Diagnoses:  Principal Problem:   Angioedema Active Problems:   Tobacco abuse   Essential hypertension, benign   Discharge Condition: stable  Diet recommendation: low sodium heart healthy  Filed Weights   04/12/14 1701 04/13/14 0126 04/13/14 2130  Weight: 75.751 kg (167 lb) 77.248 kg (170 lb 4.8 oz) 77.5 kg (170 lb 13.7 oz)    History of present illness:  Alec Mcguire is a 58 y.o. male with a history of HTN and Seizures who presents to the ED with complaints of worsening swelling of his mouth and lips despite injection of IV steroid and oral Benadryl which was given at his PCP's office today.   Hospital Course:  Principal Problem:  Angioedema of lips and face- no involvement of pharynx or tongue - due to Lisinopril - advised to stop  - added Claritin - cont H2 blocker  - states Benadryl is making his swelling worse which I doubt but have switched to Hydroxizine  - we continued low dose IV Steroids which I will stop today - will cont H2 blocker, Claritin and Hydroxyzine at home  Active Problems:  Essential hypertension, benign  - his PCP has already prescribed Amlodipine for him in place of Lisinopril-  He can pick up the prescription today- we have given it to him while he was here  Tobacco abuse  - advised to quit smoking   Procedures:  none  Consultations:  none  Discharge Exam: Filed Vitals:   04/14/14 0855  BP: 125/67  Pulse: 74  Temp: 98.5 F (36.9 C)  Resp: 19    General: AAO x 3, no distress, facial swelling nearly resolved Cardiovascular: RRR, no murmurs Respiratory: CTA b/l   Discharge Instructions You were cared for by a hospitalist during your hospital stay. If you have any questions about your discharge medications or the care you received while  you were in the hospital after you are discharged, you can call the unit and asked to speak with the hospitalist on call if the hospitalist that took care of you is not available. Once you are discharged, your primary care physician will handle any further medical issues. Please note that NO REFILLS for any discharge medications will be authorized once you are discharged, as it is imperative that you return to your primary care physician (or establish a relationship with a primary care physician if you do not have one) for your aftercare needs so that they can reassess your need for medications and monitor your lab values.      Discharge Instructions   Diet - low sodium heart healthy    Complete by:  As directed      Increase activity slowly    Complete by:  As directed             Medication List    STOP taking these medications       lisinopril 20 MG tablet  Commonly known as:  PRINIVIL,ZESTRIL      TAKE these medications       famotidine 20 MG tablet  Commonly known as:  PEPCID  Take 1 tablet (20 mg total) by mouth 2 (two) times daily.     hydrOXYzine 25 MG tablet  Commonly known as:  ATARAX/VISTARIL  Take 1 tablet (25 mg total) by mouth 4 (four) times daily - after  meals and at bedtime.     loratadine 10 MG tablet  Commonly known as:  CLARITIN  Take 1 tablet (10 mg total) by mouth daily.     Vitamin D (Ergocalciferol) 50000 UNITS Caps capsule  Commonly known as:  DRISDOL  Take 50,000 Units by mouth 2 (two) times a week.       Allergies  Allergen Reactions  . Lisinopril Swelling    Angioedema  . Tramadol Swelling    In face and lips      The results of significant diagnostics from this hospitalization (including imaging, microbiology, ancillary and laboratory) are listed below for reference.    Significant Diagnostic Studies: No results found.  Microbiology: No results found for this or any previous visit (from the past 240 hour(s)).   Labs: Basic  Metabolic Panel:  Recent Labs Lab 04/12/14 2304 04/13/14 0400  NA 134* 134*  K 4.2 4.9  CL 101 97  CO2  --  23  GLUCOSE 204* 176*  BUN 19 20  CREATININE 1.10 0.97  CALCIUM  --  9.4   Liver Function Tests: No results found for this basename: AST, ALT, ALKPHOS, BILITOT, PROT, ALBUMIN,  in the last 168 hours No results found for this basename: LIPASE, AMYLASE,  in the last 168 hours No results found for this basename: AMMONIA,  in the last 168 hours CBC:  Recent Labs Lab 04/12/14 2257 04/12/14 2304 04/13/14 0400  WBC 7.3  --  5.0  HGB 11.7* 15.0 11.4*  HCT 36.2* 44.0 35.2*  MCV 74.9*  --  74.6*  PLT 190  --  174   Cardiac Enzymes: No results found for this basename: CKTOTAL, CKMB, CKMBINDEX, TROPONINI,  in the last 168 hours BNP: BNP (last 3 results) No results found for this basename: PROBNP,  in the last 8760 hours CBG: No results found for this basename: GLUCAP,  in the last 168 hours     Signed:  Debbe Odea, MD [facility] 04/14/2014, 11:11 AM

## 2015-06-04 IMAGING — CR DG CHEST 2V
2 series · 2 of 2 positions shown · non-contrast
Comparison: None.

CLINICAL DATA: Preop for excision of a neck and back mass, history
of hypertension and gastroesophageal reflux.

EXAM:
CHEST  2 VIEW

[w chest pa]
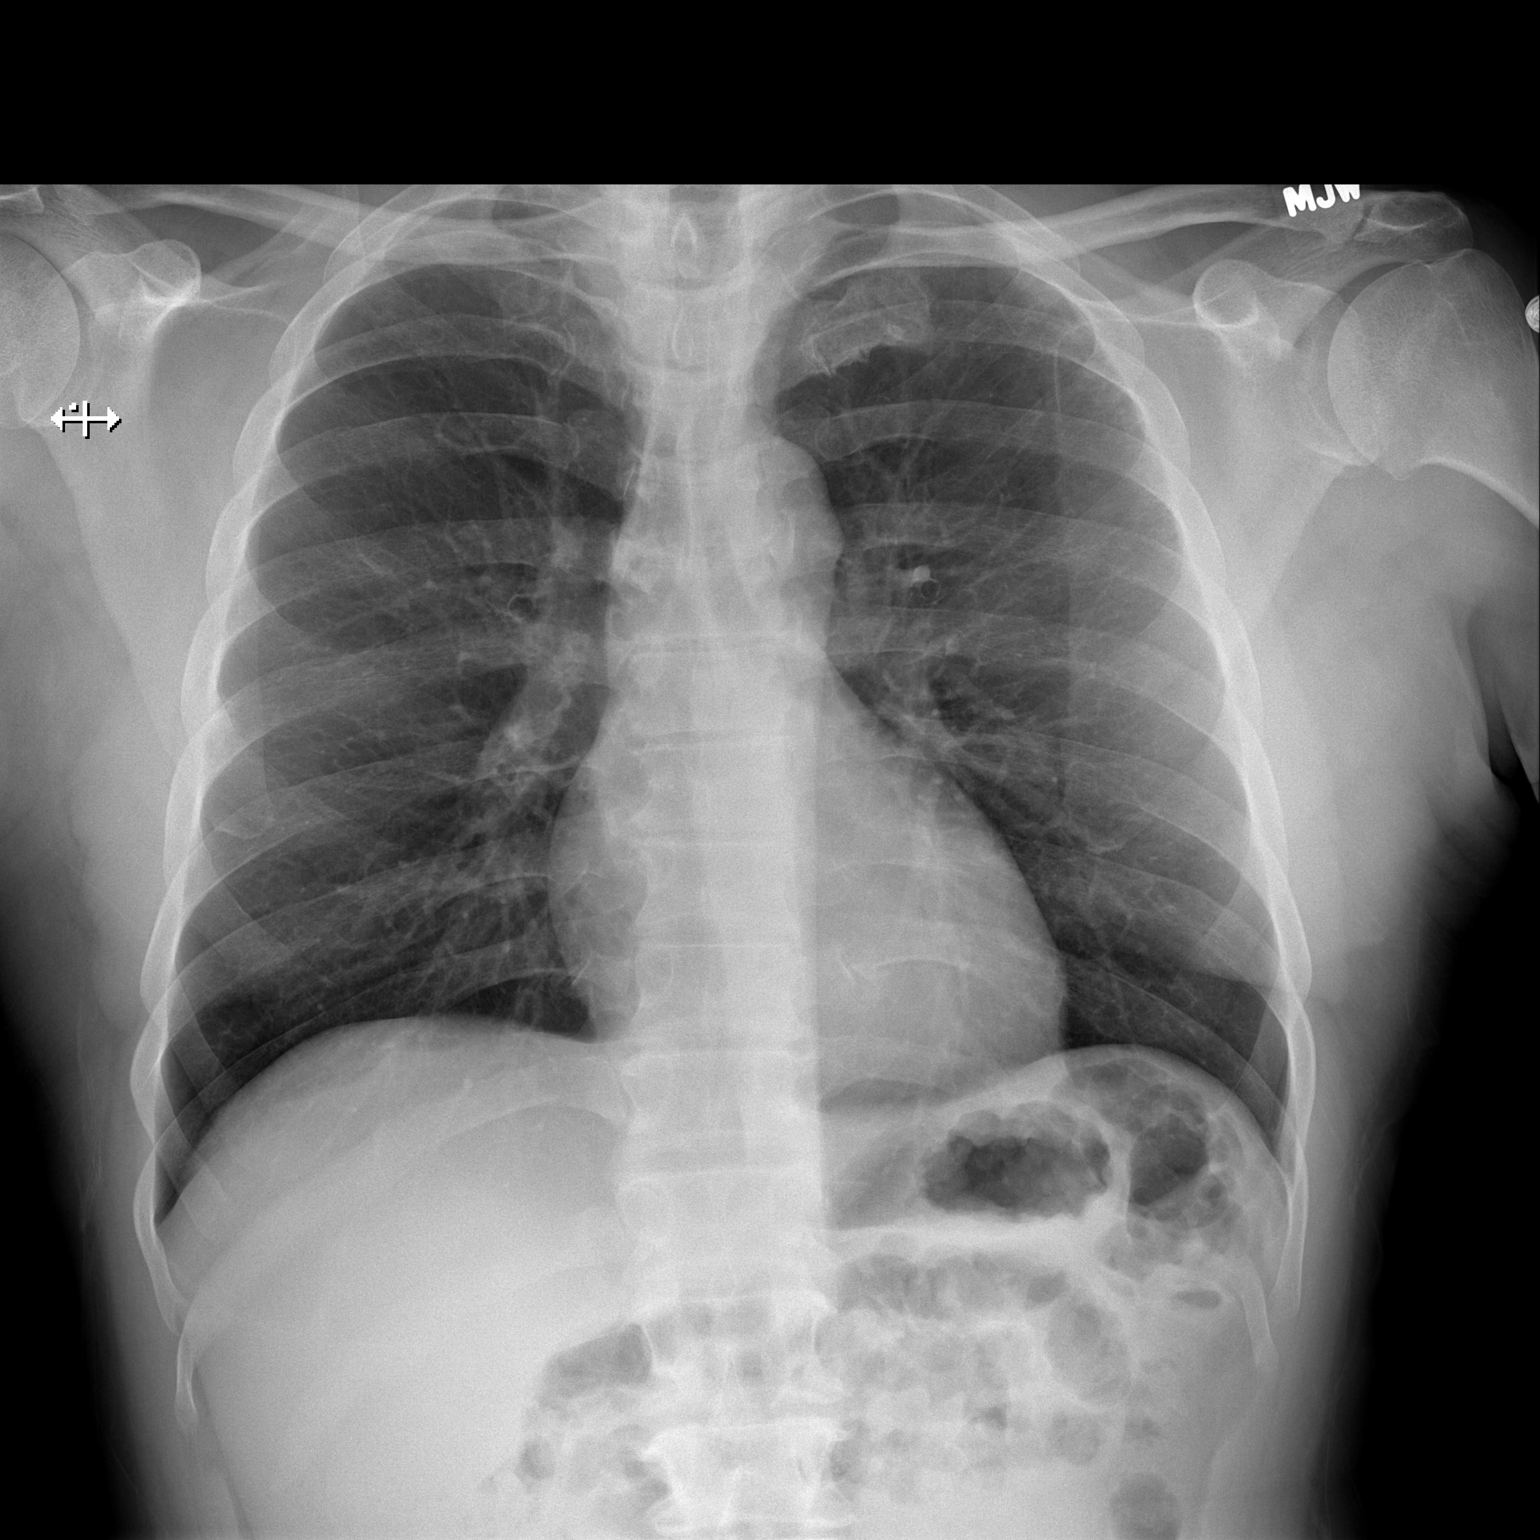

[w chest lat]
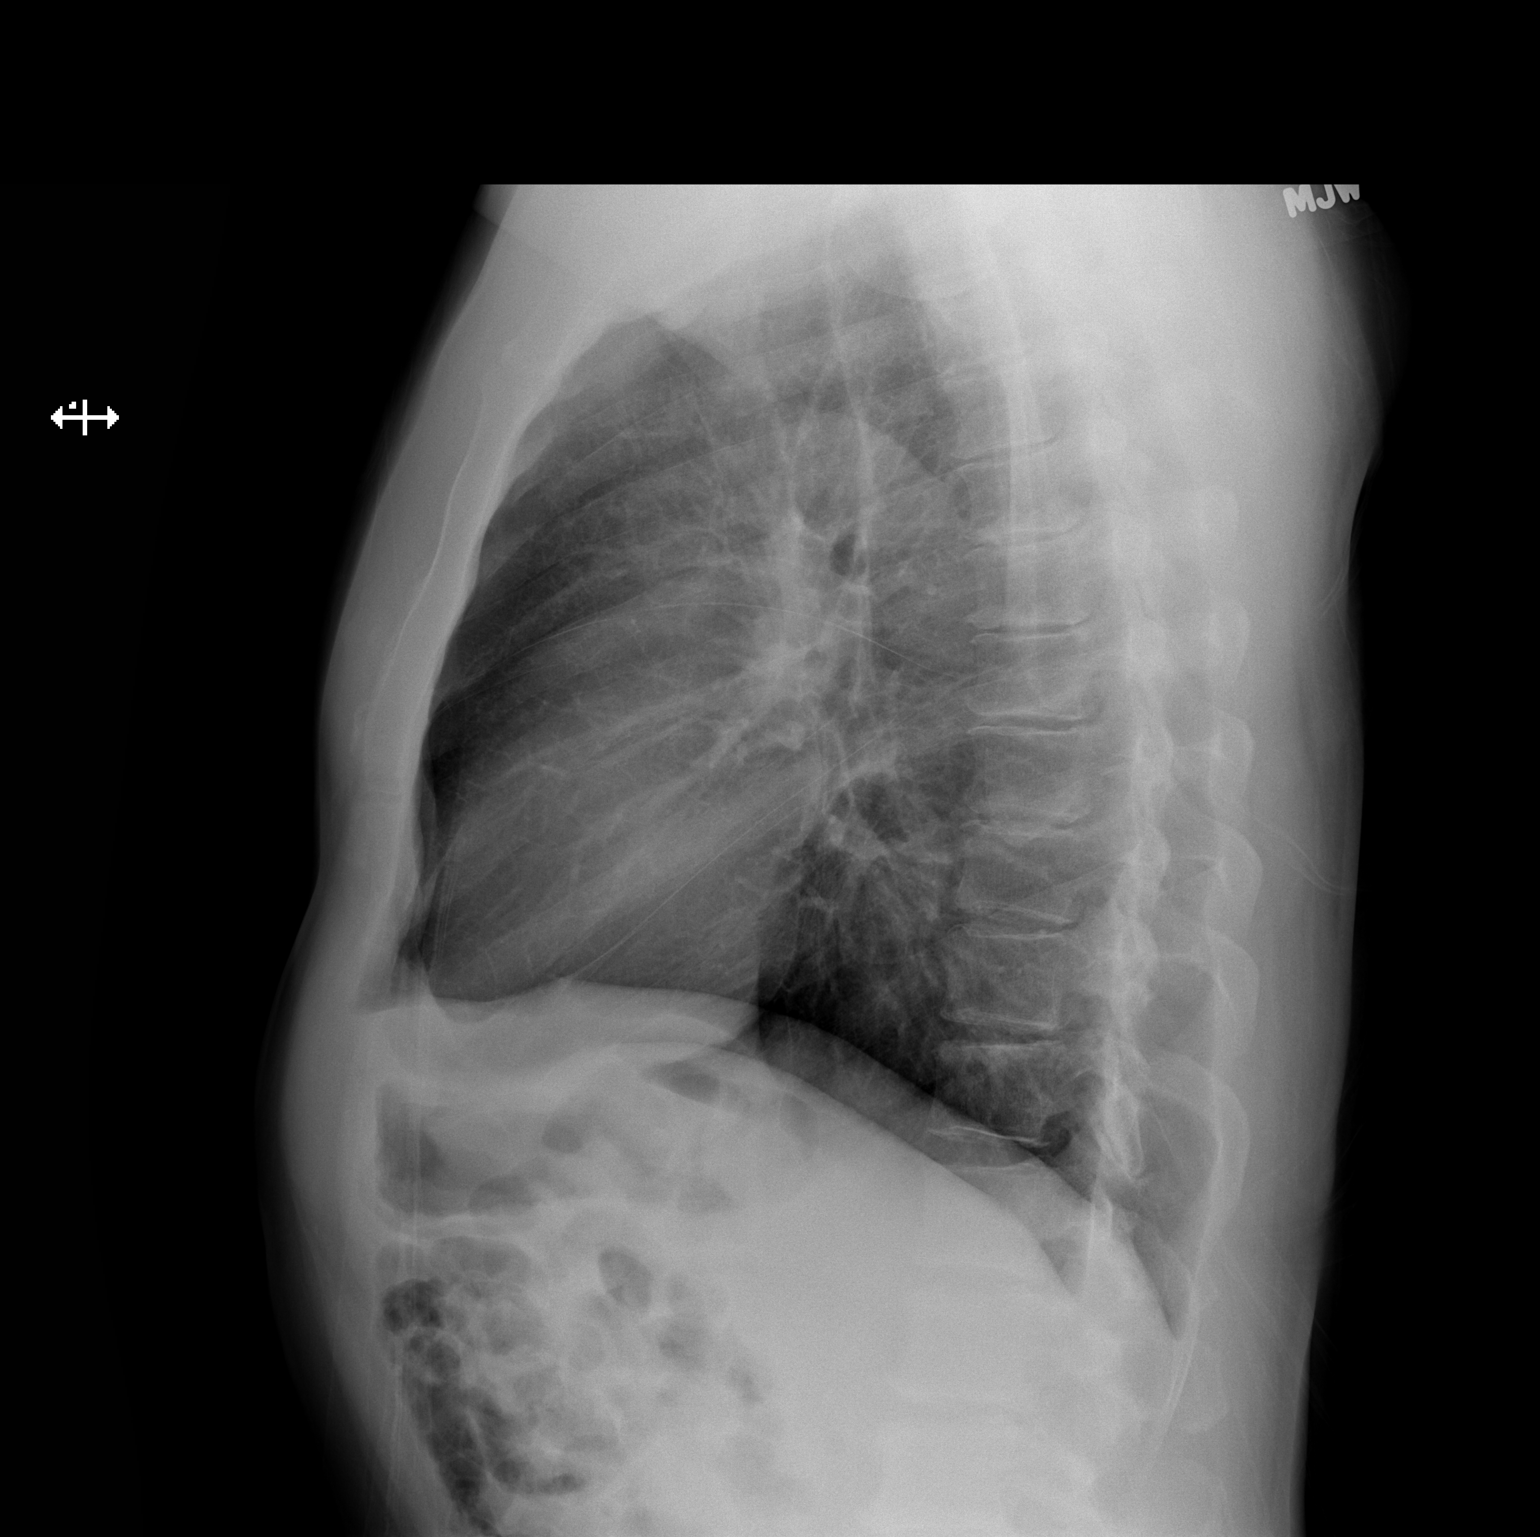

[2 of 2 positions shown; findings below may reference images not displayed]

FINDINGS: The lungs are adequately inflated and clear. The cardiac silhouette
is normal in size. The mediastinum is normal in width. The pulmonary
vascularity is not engorged. There is no pleural effusion or
pneumothorax. The observed portions of the bony thorax appear
normal.
IMPRESSION: There is no evidence of active cardiopulmonary disease.
